# Patient Record
Sex: Female | Born: 1990 | Race: White | Hispanic: No | Marital: Single | Smoking: Former smoker
Health system: Southern US, Community
[De-identification: ages and names within clinical notes are randomized; demographics above are authoritative.]

## PROBLEM LIST (undated history)

## (undated) DIAGNOSIS — R519 Headache, unspecified: Secondary | ICD-10-CM

## (undated) DIAGNOSIS — O24419 Gestational diabetes mellitus in pregnancy, unspecified control: Secondary | ICD-10-CM

## (undated) DIAGNOSIS — Z8489 Family history of other specified conditions: Secondary | ICD-10-CM

## (undated) DIAGNOSIS — F329 Major depressive disorder, single episode, unspecified: Secondary | ICD-10-CM

## (undated) DIAGNOSIS — R51 Headache: Secondary | ICD-10-CM

## (undated) DIAGNOSIS — F32A Depression, unspecified: Secondary | ICD-10-CM

## (undated) DIAGNOSIS — D649 Anemia, unspecified: Secondary | ICD-10-CM

## (undated) HISTORY — PX: NO PAST SURGERIES: SHX2092

---
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
## 2012-03-18 ENCOUNTER — Emergency Department (HOSPITAL_COMMUNITY)
Admission: EM | Admit: 2012-03-18 | Discharge: 2012-03-18 | Disposition: A | Discharge status: Home / Self Care | Payer: Self-pay | Attending: Emergency Medicine | Admitting: Emergency Medicine

## 2012-03-18 ENCOUNTER — Encounter (HOSPITAL_COMMUNITY): Payer: Self-pay | Admitting: Emergency Medicine

## 2012-03-18 DIAGNOSIS — R112 Nausea with vomiting, unspecified: Secondary | ICD-10-CM | POA: Insufficient documentation

## 2012-03-18 DIAGNOSIS — R1110 Vomiting, unspecified: Secondary | ICD-10-CM

## 2012-03-18 DIAGNOSIS — E860 Dehydration: Secondary | ICD-10-CM | POA: Insufficient documentation

## 2012-03-18 DIAGNOSIS — R197 Diarrhea, unspecified: Secondary | ICD-10-CM | POA: Insufficient documentation

## 2012-03-18 LAB — POCT I-STAT, CHEM 8
Creatinine, Ser: 0.7 mg/dL (ref 0.50–1.10)
Glucose, Bld: 147 mg/dL — ABNORMAL HIGH (ref 70–99)
HCT: 46 % (ref 36.0–46.0)
Hemoglobin: 15.6 g/dL — ABNORMAL HIGH (ref 12.0–15.0)
Potassium: 4.2 mEq/L (ref 3.5–5.1)
TCO2: 25 mmol/L (ref 0–100)

## 2012-03-18 MED ORDER — PROMETHAZINE HCL 25 MG PO TABS: 25.0000 mg | ORAL_TABLET | Freq: Four times a day (QID) | ORAL | Status: DC | PRN

## 2012-03-18 MED ORDER — METOCLOPRAMIDE HCL 5 MG/ML IJ SOLN
10.0000 mg | Freq: Once | INTRAMUSCULAR | Status: AC
  Administered 2012-03-18: 10 mg via INTRAVENOUS
  Filled 2012-03-18: qty 2

## 2012-03-18 MED ORDER — SODIUM CHLORIDE 0.9 % IV SOLN: 1000.0000 mL | INTRAVENOUS | Status: DC

## 2012-03-18 MED ORDER — SODIUM CHLORIDE 0.9 % IV SOLN
1000.0000 mL | Freq: Once | INTRAVENOUS | Status: AC
  Administered 2012-03-18: 1000 mL via INTRAVENOUS

## 2012-03-18 MED ORDER — LOPERAMIDE HCL 2 MG PO CAPS
4.0000 mg | ORAL_CAPSULE | Freq: Once | ORAL | Status: AC
  Administered 2012-03-18: 4 mg via ORAL
  Filled 2012-03-18: qty 2

## 2012-03-18 MED ORDER — ONDANSETRON HCL 4 MG/2ML IJ SOLN
4.0000 mg | Freq: Once | INTRAMUSCULAR | Status: AC
  Administered 2012-03-18: 4 mg via INTRAVENOUS
  Filled 2012-03-18: qty 2

## 2012-03-18 MED ORDER — DIPHENHYDRAMINE HCL 50 MG/ML IJ SOLN
25.0000 mg | Freq: Once | INTRAMUSCULAR | Status: AC
  Administered 2012-03-18: 25 mg via INTRAVENOUS
  Filled 2012-03-18: qty 1

## 2012-03-18 MED ORDER — PROMETHAZINE HCL 25 MG RE SUPP: 25.0000 mg | Freq: Four times a day (QID) | RECTAL | Status: DC | PRN

## 2012-03-18 NOTE — ED Provider Notes (Signed)
History     CSN: [id number]  Arrival date & time 03/18/12  1116   First MD Initiated Contact with Patient 03/18/12 1151      Chief Complaint  Patient presents with  . Emesis  . Nausea  . Diarrhea    (Consider location/radiation/quality/duration/timing/severity/associated sxs/prior treatment) HPI  Patient reports she awakened at 3 AM with acute onset of nausea and then started having vomiting about 4-5 times. About 5 AM she had about 3 episodes of diarrhea. She states she feels like she has a band around her abdomen that goes into her back and described as a cramping pain. She currently still has nausea. Her last episode of diarrhea was about 2 hours ago. She relates her she feels like she could go at any time now. She denies any fever. She states just prior to arrival she drank milk and took some ibuprofen. She reports the night before her son had some vomiting however he is improved now. She denies eating or drinking anything that may have made her feel ill. She states she has weakness and lightheadedness.  PCP none  History reviewed. No pertinent past medical history.  History reviewed. No pertinent past surgical history.  No family history on file.  History  Substance Use Topics  . Smoking status: Never Smoker   . Smokeless tobacco: Not on file  . Alcohol Use: No  Lives at home Lives with spouse  OB History    Grav Para Term Preterm Abortions TAB SAB Ect Mult Living                  Review of Systems  All other systems reviewed and are negative.    Allergies  Review of patient's allergies indicates no known allergies.  Home Medications   Current Outpatient Rx  Name  Route  Sig  Dispense  Refill  . IBUPROFEN 200 MG PO TABS   Oral   Take 400 mg by mouth every 8 (eight) hours as needed. For pain.           BP 134/86  Pulse 87  Temp 97.6 F (36.4 C)  Resp 16  SpO2 97%  Vital signs normal    Physical Exam  Nursing note and vitals  reviewed. Constitutional: She is oriented to person, place, and time. She appears well-developed and well-nourished.  Non-toxic appearance. She does not appear ill. No distress.  HENT:  Head: Normocephalic and atraumatic.  Right Ear: External ear normal.  Left Ear: External ear normal.  Nose: Nose normal. No mucosal edema or rhinorrhea.  Mouth/Throat: Mucous membranes are normal. No dental abscesses or uvula swelling.       Dry tongue  Eyes: Conjunctivae normal and EOM are normal. Pupils are equal, round, and reactive to light.  Neck: Normal range of motion and full passive range of motion without pain. Neck supple.  Cardiovascular: Normal rate, regular rhythm and normal heart sounds.  Exam reveals no gallop and no friction rub.   No murmur heard. Pulmonary/Chest: Effort normal and breath sounds normal. No respiratory distress. She has no wheezes. She has no rhonchi. She has no rales. She exhibits no tenderness and no crepitus.  Abdominal: Soft. Normal appearance and bowel sounds are normal. She exhibits no distension. There is tenderness. There is no rebound and no guarding.       Mild tenderness in the mid abdomen  Musculoskeletal: Normal range of motion. She exhibits no edema and no tenderness.       Moves  all extremities well.   Neurological: She is alert and oriented to person, place, and time. She has normal strength. No cranial nerve deficit.  Skin: Skin is warm, dry and intact. No rash noted. No erythema. There is pallor.  Psychiatric: She has a normal mood and affect. Her speech is normal and behavior is normal. Her mood appears not anxious.    ED Course  Procedures (including critical care time)   Medications  ibuprofen (ADVIL,MOTRIN) 200 MG tablet (not administered)  0.9 %  sodium chloride infusion (0 mL Intravenous Stopped 03/18/12 1353)    Followed by  0.9 %  sodium chloride infusion (1000 mL Intravenous New Bag/Given 03/18/12 1355)    Followed by  0.9 %  sodium chloride  infusion (not administered)  metoCLOPramide (REGLAN) injection 10 mg (10 mg Intravenous Given 03/18/12 1254)  diphenhydrAMINE (BENADRYL) injection 25 mg (25 mg Intravenous Given 03/18/12 1255)  ondansetron (ZOFRAN) injection 4 mg (4 mg Intravenous Given 03/18/12 1253)  loperamide (IMODIUM) capsule 4 mg (4 mg Oral Given 03/18/12 1257)   Recheck after first liter patient feeling better, no urinary output. Will order second liter  Recheck at 1420 patient has colored her face now. She is sitting up and looks like she feels much improved. She's been able to drink some fluids and is feeling ready to go home.   Results for orders placed during the hospital encounter of 03/18/12  POCT I-STAT, CHEM 8      Component Value Range   Sodium 140  135 - 145 mEq/L   Potassium 4.2  3.5 - 5.1 mEq/L   Chloride 105  96 - 112 mEq/L   BUN 19  6 - 23 mg/dL   Creatinine, Ser 4.54  0.50 - 1.10 mg/dL   Glucose, Bld 098 (*) 70 - 99 mg/dL   Calcium, Ion 1.19  1.47 - 1.23 mmol/L   TCO2 25  0 - 100 mmol/L   Hemoglobin 15.6 (*) 12.0 - 15.0 g/dL   HCT 82.9  56.2 - 13.0 %   Laboratory interpretation all normal except mildly elevated hemoglobin consistent with dehydration .   1. Vomiting and diarrhea   2. Dehydration     New Prescriptions   PROMETHAZINE (PHENERGAN) 25 MG SUPPOSITORY    Place 1 suppository (25 mg total) rectally every 6 (six) hours as needed for nausea.   PROMETHAZINE (PHENERGAN) 25 MG TABLET    Take 1 tablet (25 mg total) by mouth every 6 (six) hours as needed for nausea.    Plan discharge  Devoria Albe, MD, FACEP   MDM          Ward Givens, MD 03/18/12 1430

## 2012-03-18 NOTE — ED Notes (Signed)
Woke up at 0300 this am vomiting and then every 20 minutes until around 1000, watery diarrhea, child in house has same

## 2012-03-18 NOTE — Progress Notes (Signed)
On 03/18/12 during [facility] ED visit pt was noted to be listed with no insurance coverage CM and [facility] liaison spoke with pt.  Pt offered services to assist with finding a [place] self pay provider & health reform information Pt states she is completing application and should have medicaid for her son- Mothers generally receive family planning limited medicaid when they have a newborn/toddler but may not have hospital plus other services

## 2013-08-14 ENCOUNTER — Emergency Department: Payer: Self-pay | Admitting: Internal Medicine

## 2013-09-05 ENCOUNTER — Emergency Department: Payer: Self-pay | Admitting: Emergency Medicine

## 2013-09-07 LAB — BETA STREP CULTURE(ARMC)

## 2014-07-10 ENCOUNTER — Ambulatory Visit
Admit: 2014-07-10 | Disposition: A | Discharge status: Home / Self Care | Payer: Self-pay | Attending: Physician Assistant | Admitting: Physician Assistant

## 2015-04-21 ENCOUNTER — Encounter: Payer: Self-pay | Admitting: Emergency Medicine

## 2015-04-21 ENCOUNTER — Emergency Department: Payer: Worker's Compensation

## 2015-04-21 ENCOUNTER — Emergency Department
Admission: EM | Admit: 2015-04-21 | Discharge: 2015-04-21 | Disposition: A | Discharge status: Home / Self Care | Payer: Worker's Compensation | Attending: Emergency Medicine | Admitting: Emergency Medicine

## 2015-04-21 DIAGNOSIS — W208XXA Other cause of strike by thrown, projected or falling object, initial encounter: Secondary | ICD-10-CM | POA: Insufficient documentation

## 2015-04-21 DIAGNOSIS — Y9389 Activity, other specified: Secondary | ICD-10-CM | POA: Insufficient documentation

## 2015-04-21 DIAGNOSIS — S60212A Contusion of left wrist, initial encounter: Secondary | ICD-10-CM | POA: Diagnosis not present

## 2015-04-21 DIAGNOSIS — Y998 Other external cause status: Secondary | ICD-10-CM | POA: Diagnosis not present

## 2015-04-21 DIAGNOSIS — S62025A Nondisplaced fracture of middle third of navicular [scaphoid] bone of left wrist, initial encounter for closed fracture: Secondary | ICD-10-CM | POA: Diagnosis not present

## 2015-04-21 DIAGNOSIS — S62002A Unspecified fracture of navicular [scaphoid] bone of left wrist, initial encounter for closed fracture: Secondary | ICD-10-CM

## 2015-04-21 DIAGNOSIS — Y9289 Other specified places as the place of occurrence of the external cause: Secondary | ICD-10-CM | POA: Insufficient documentation

## 2015-04-21 DIAGNOSIS — S6992XA Unspecified injury of left wrist, hand and finger(s), initial encounter: Secondary | ICD-10-CM | POA: Diagnosis present

## 2015-04-21 MED ORDER — IBUPROFEN 800 MG PO TABS: 800.0000 mg | ORAL_TABLET | Freq: Three times a day (TID) | ORAL | Status: DC | PRN

## 2015-04-21 NOTE — Discharge Instructions (Signed)
Scaphoid Fracture, Wrist A fracture is a break in the bone. The bone you have broken often does not show up as a fracture on x-ray until later on in the healing phase. This bone is called the scaphoid bone. With this bone, your caregiver will often cast or splint your wrist as though it is fractured, even if a fracture is not seen on the x-ray. This is often done with wrist injuries in which there is tenderness at the base of the thumb. An x-ray at 1-3 weeks after your injury may confirm this fracture. A cast or splint is used to protect and keep your injured bone in good position for healing. The cast or splint will be on generally for about 6 to 16 weeks, depending on your health, age, the fracture location and how quickly you heal. Another name for the scaphoid bone is the navicular bone. HOME CARE INSTRUCTIONS   To lessen the swelling and pain, keep the injured part elevated above your heart while sitting or lying down.  Apply ice to the injury for 15-20 minutes, 03-04 times per day while awake, for 2 days. Put the ice in a plastic bag and place a thin towel between the bag of ice and your cast.  If you have a plaster or fiberglass cast or splint:  Do not try to scratch the skin under the cast using sharp or pointed objects.  Check the skin around the cast every day. You may put lotion on any red or sore areas.  Keep your cast or splint dry and clean.  If you have a plaster splint:  Wear the splint as directed.  You may loosen the elastic bandage around the splint if your fingers become numb, tingle, or turn cold or blue.  If you have been put in a removable splint, wear and use as directed.  Do not put pressure on any part of your cast or splint; it may deform or break. Rest your cast or splint only on a pillow the first 24 hours until it is fully hardened.  Your cast or splint can be protected during bathing with a plastic bag. Do not lower the cast or splint into water.  Only take  over-the-counter or prescription medicines for pain, discomfort, or fever as directed by your caregiver.  If your caregiver has given you a follow up appointment, it is very important to keep that appointment. Not keeping the appointment could result in chronic pain and decreased function. If there is any problem keeping the appointment, you must call back to this facility for assistance. SEEK IMMEDIATE MEDICAL CARE IF:   Your cast gets damaged, wet or breaks.  You have continued severe pain or more swelling than you did before the cast or splint was put on.  Your skin or nails below the injury turn blue or gray, or feel cold or numb.  You have tingling or burning pain in your fingers or increasing pain with movement of your fingers   This information is not intended to replace advice given to you by your health care provider. Make sure you discuss any questions you have with your health care provider.   Document Released: 03/07/2002 Document Revised: 06/09/2011 Document Reviewed: 09/27/2014 Elsevier Interactive Patient Education 2016 [employer]vier [employer]   Continue ibuprofen for pain and swelling. Follow-up with the orthopedist next week for further evaluation. Return to emergency room for any concerns.

## 2015-04-21 NOTE — ED Notes (Signed)
Assessed per PA 

## 2015-04-21 NOTE — ED Provider Notes (Signed)
[facility] Emergency Department Provider Note  ____________________________________________  Time seen: Approximately 7:01 PM  I have reviewed the triage vital signs and the nursing notes.   HISTORY  Chief Complaint Wrist Pain    HPI Sharon Solis is a 25 y.o. female who reports a heavy object hitting her left wrist, radial side prior to arrival. She has pain with lateral movement of the wrist. She has mild bruising. This occurred at her place of employment. Food trays fell onto her wrist.   History reviewed. No pertinent past medical history.  There are no active problems to display for this patient.   History reviewed. No pertinent past surgical history.  Current Outpatient Rx  Name  Route  Sig  Dispense  Refill  . ibuprofen (ADVIL,MOTRIN) 800 MG tablet   Oral   Take 1 tablet (800 mg total) by mouth every 8 (eight) hours as needed.   15 tablet   0   . promethazine (PHENERGAN) 25 MG suppository   Rectal   Place 1 suppository (25 mg total) rectally every 6 (six) hours as needed for nausea.   4 each   0   . promethazine (PHENERGAN) 25 MG tablet   Oral   Take 1 tablet (25 mg total) by mouth every 6 (six) hours as needed for nausea.   4 tablet   0     Allergies Review of patient's allergies indicates no known allergies.  No family history on file.  Social History Social History  Substance Use Topics  . Smoking status: Never Smoker   . Smokeless tobacco: None  . Alcohol Use: No    Review of Systems Constitutional: No fever/chills Eyes: No visual changes. ENT: No sore throat. Musculoskeletal: Negative for back pain. Skin: Negative for rash. Neurological: Negative for headaches, focal weakness or numbness. 10-point ROS otherwise negative.  ____________________________________________   PHYSICAL EXAM:  VITAL SIGNS: ED Triage Vitals  Enc Vitals Group     BP 04/21/15 1833 118/80 mmHg     Pulse Rate 04/21/15 1833  100     Resp 04/21/15 1833 98     Temp 04/21/15 1833 98 F (36.7 C)     Temp src --      SpO2 04/21/15 1833 98 %     Weight 04/21/15 1833 210 lb (95.255 kg)     Height 04/21/15 1833  (1.676 m)     Head Cir --      Peak Flow --      Pain Score 04/21/15 1834 8     Pain Loc --      Pain Edu? --      Excl. in GC? --     Constitutional: Alert and oriented. Well appearing and in no acute distress. Eyes: Conjunctivae are normal. PERRL. EOMI. Ears:  Clear with normal landmarks. No erythema. Head: Atraumatic.  Musculoskeletal: Normal range of motion of the left elbow. Tender over the radial aspect of the left wrist in the snuffbox region. Mild bruising, with mild swelling. Flexion and extension intact. Nontender over the phalanges. Pain with range of motion of the thumb. Nontender over the metacarpal bones. 2+ radial pulse. Nontender over the ulna. Neurologic:  Normal speech and language. No gross focal neurologic deficits are appreciated. No gait instability. Skin:  Skin is warm, dry and intact. No rash noted. Psychiatric: Mood and affect are normal. Speech and behavior are normal.  ____________________________________________   LABS (all labs ordered are listed, but only abnormal results are  displayed)  Labs Reviewed - No data to display ____________________________________________  EKG   ____________________________________________  RADIOLOGY  CLINICAL DATA: Distal radial pain, status post fall on the wrist.  EXAM: LEFT WRIST - COMPLETE 3+ VIEW  COMPARISON: None.  FINDINGS: There is no evidence of displaced fracture or dislocation. Subtle curvilinear lucency is seen through the midshaft of the scaphoid bone. There is no evidence of arthropathy or other focal bone abnormality. Soft tissues are unremarkable.  IMPRESSION: No evidence of displaced fracture.  Subtle curvilinear lucency through the midshaft of the scaphoid bone. These may represent a normal  anatomic variant or represent subtle nondisplaced fracture. Please correlate to point tenderness in the anatomic snuffbox. Repeated radiograph in 10-14 days may be considered if point tenderness is present.   Electronically Signed  By: Ted Mcalpine M.D.  On: 04/21/2015 19:04 ____________________________________________   PROCEDURES  Procedure(s) performed: None  Critical Care performed: No  ____________________________________________   INITIAL IMPRESSION / ASSESSMENT AND PLAN / ED COURSE  Pertinent labs & imaging results that were available during my care of the patient were reviewed by me and considered in my medical decision making (see chart for details).  25 year old female with trauma to the left wrist. X-ray with possible subtle scaphoid fracture.  Place in thumb spica splint and encouraged ice, brace and ibuprofen. She will follow-up with orthopedics next week  5-7 days for further evaluation. Work note given.   FINAL CLINICAL IMPRESSION(S) / ED DIAGNOSES  Final diagnoses:  Scaphoid fracture, wrist, closed, left, initial encounter      Ignacia Bayley, PA-C 04/21/15 1934  Arnaldo Natal, MD 04/21/15 [phone]

## 2015-04-21 NOTE — ED Notes (Signed)
Splint applied. Capillary refills and pluses checked prior and after splinting application. Pt. Given instructions and verbalized comfort with splint on. Nurse and physician notified.  

## 2015-04-21 NOTE — ED Notes (Signed)
Splint applied by tech and pulses checked before discharge

## 2015-04-21 NOTE — ED Notes (Signed)
Trying to catch falling trays overhead approx 3pm. Pain L wrist.

## 2015-07-20 ENCOUNTER — Emergency Department: Payer: Medicaid Other

## 2015-07-20 ENCOUNTER — Emergency Department
Admission: EM | Admit: 2015-07-20 | Discharge: 2015-07-20 | Disposition: A | Discharge status: Home / Self Care | Payer: Medicaid Other | Attending: Emergency Medicine | Admitting: Emergency Medicine

## 2015-07-20 DIAGNOSIS — M65242 Calcific tendinitis, left hand: Secondary | ICD-10-CM | POA: Diagnosis not present

## 2015-07-20 DIAGNOSIS — M778 Other enthesopathies, not elsewhere classified: Secondary | ICD-10-CM

## 2015-07-20 DIAGNOSIS — M25532 Pain in left wrist: Secondary | ICD-10-CM | POA: Diagnosis present

## 2015-07-20 DIAGNOSIS — M779 Enthesopathy, unspecified: Secondary | ICD-10-CM

## 2015-07-20 NOTE — ED Notes (Signed)
Pt here for left wrist pain.  Has broken wrist a few months ago.

## 2015-07-20 NOTE — ED Provider Notes (Signed)
[facility] Emergency Department Provider Note ____________________________________________  Time seen: 2009  I have reviewed the triage vital signs and the nursing notes.  HISTORY  Chief Complaint  Wrist Pain  HPI Sharon Solis is a 25 y.o. female presents to the ED for evaluation of pain to her left wrist this morning after she pushed off to get out of bed. The patient has been evaluated recently by orthopedics for a suspected scaphoid fracture distal left wrist from January. She had repeat x-rays with orthopedics provider about a month ago, which showed normal healing of the scaphoid. 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 She describes pain seems to be worse when she flexes the thumb towards the wrist. She describes the pain as "grinding"in nature. She denies any grip changes, distal paresthesias, or swelling to the hand. She reports overall discomfort 8/10 in triage. She does note that the pain is intermittent.  No past medical history on file.  There are no active problems to display for this patient.  No past surgical history on file.  Current Outpatient Rx  Name  Route  Sig  Dispense  Refill  . ibuprofen (ADVIL,MOTRIN) 800 MG tablet   Oral   Take 1 tablet (800 mg total) by mouth every 8 (eight) hours as needed.   15 tablet   0   . promethazine (PHENERGAN) 25 MG suppository   Rectal   Place 1 suppository (25 mg total) rectally every 6 (six) hours as needed for nausea.   4 each   0   . promethazine (PHENERGAN) 25 MG tablet   Oral   Take 1 tablet (25 mg total) by mouth every 6 (six) hours as needed for nausea.   4 tablet   0    Allergies Review of patient's allergies indicates no known allergies.  No family history on file.  Social History Social History  Substance Use Topics  . Smoking status: Never Smoker   . Smokeless tobacco: Not on file  . Alcohol Use: No    Review of Systems  Constitutional: Negative for fever. Cardiovascular: Negative for chest pain. Respiratory: Negative for shortness of breath. Musculoskeletal: Negative for back pain. Left wrist pain as above.  Skin: Negative for rash. Neurological: Negative for headaches, focal weakness or numbness. ____________________________________________  PHYSICAL EXAM:  VITAL SIGNS: ED Triage Vitals  Enc Vitals Group     BP 07/20/15 1845 147/78 mmHg     Pulse Rate 07/20/15 1845 97     Resp 07/20/15 1845 16     Temp 07/20/15 1845 98.9 F (37.2 C)     Temp Source 07/20/15 1845 Oral     SpO2 07/20/15 1845 97 %     Weight --      Height --      Head Cir --      Peak Flow --      Pain Score 07/20/15 1845 8     Pain Loc --      Pain Edu? --      Excl. in GC? --    Constitutional: Alert and oriented. Well appearing and in no distress. Head: Normocephalic and atraumatic. Hematological/Lymphatic/Immunological: No cervical lymphadenopathy. Cardiovascular: Normal rate, regular rhythm.  Respiratory: Normal respiratory effort. Musculoskeletal: Left hand and wrist without any obvious deformity, or radial swelling.  She does have a mildly positive Finklestein on exam. She is not tender to palpation in the anatomical snuffbox. Nontender with  normal range of motion in all extremities.  Neurologic: Cranial nerves II through XII grossly intact. Patient with normal intrinsic and opposition testing.  Normal gait without ataxia. Normal speech and language. No gross focal neurologic deficits are appreciated. Skin:  Skin is warm, dry and intact. No rash noted. ____________________________________________   RADIOLOGY  Left Wrist IMPRESSION: No acute fracture or dislocation of the left wrist. ____________________________________________  INITIAL IMPRESSION / ASSESSMENT AND PLAN / ED COURSE  Evaluation with a likely left wrist tendinitis at the thumb. She'll be discharged with continued use of her  thumb spica splint and encouraged to apply ice for inflammation and pain relief. She'll follow up with a primary care provider or her orthopedics specialist for ongoing symptom management. ____________________________________________  FINAL CLINICAL IMPRESSION(S) / ED DIAGNOSES  Final diagnoses:  Tendinitis of thumb      Lissa Hoard, PA-C 07/20/15 2100  Phineas Semen, MD 07/21/15 1556

## 2015-07-20 NOTE — Discharge Instructions (Signed)
Tendinitis and Tenosynovitis  °Tendinitis is inflammation of the tendon. Tenosynovitis is inflammation of the lining around the tendon (tendon sheath). These painful conditions often occur at once. Tendons attach muscle to bone. To move a limb, force from the muscle moves through the tendon, to the bone. These conditions often cause increased pain when moving. Tendinitis may be caused by a small or partial tear in the tendon.  °SYMPTOMS  °· Pain, tenderness, redness, bruising, or swelling at the injury. °· Loss of normal joint movement. °· Pain that gets worse with use of the muscle and joint attached to the tendon. °· Weakness in the tendon, caused by calcium build up that may occur with tendinitis. °· Commonly affected tendons: °¨ Achilles tendon (calf of leg). °¨ Rotator cuff (shoulder joint). °¨ Patellar tendon (kneecap to shin). °¨ Peroneal tendon (ankle). °¨ Posterior tibial tendon (inner ankle). °¨ Biceps tendon (in front of shoulder). °CAUSES  °· Sudden strain on a flexed muscle, muscle overuse, sudden increase or change in activity, vigorous activity. °· Result of a direct hit (less common). °· Poor muscle action (biomechanics). °RISK INCREASES WITH: °· Injury (trauma). °· Too much exercise. °· Sudden change in athletic activity. °· Incorrect exercise form or technique. °· Poor strength and flexibility. °· Not warming-up properly before activity. °· Returning to activity before healing is complete. °PREVENTION  °· Warm-up and stretch properly before activity. °· Maintain physical fitness: °¨ Joint flexibility. °¨ Muscle strength and endurance. °¨ Fitness that increases heart rate. °· Learn and use proper exercise techniques. °· Use rehabilitation exercises to strengthen weak muscles and tendons. °· Ice the tendon after activity, to reduce recurring inflammation. °· Wear proper fitting protective equipment for specific tendons, when indicated. °PROGNOSIS  °When treated properly, can be cured in 6 to 8 weeks.  Recovery may take longer, depending on degree of injury.  °RELATED COMPLICATIONS  °· Re-injury or recurring symptoms. °· Permanent weakness or joint stiffness, if injury is severe and recovery is not completed. °· Delayed healing, if sports are started before healing is complete. °· Tearing apart (rupture) of the inflamed tendon. Tendinitis means the tendon is injured and must recover. °TREATMENT  °Treatment first involves ice, medicine, and rest from aggravating activities. This reduces pain and inflammation. Modifying your activity may be considered to prevent recurring injury. A brace, elastic bandage wrap, splint, cast, or sling may be prescribed to protect the joint for a short period. After that period, strengthening and stretching exercise may help to regain strength and full range of motion. If the condition persists, despite non-surgical treatment, surgery may be recommended to remove the inflamed tendon lining. Corticosteroid injections may be given to reduce inflammation. However, these injections may weaken the tendon and increase your risk for tendon rupture. °MEDICATION  °· If pain medicine is needed, nonsteroidal anti-inflammatory medicines (aspirin and ibuprofen), or other minor pain relievers (acetaminophen), are often recommended. °· Do not take pain medicine for 7 days before surgery. °· Prescription pain relievers are usually prescribed only after surgery. Use only as directed and only as much as you need. °· Ointments applied to the skin may be helpful. °· Corticosteroid injections may be given to reduce inflammation. However, this may increase your risk of a tendon rupture. °HEAT AND COLD °· Cold treatment (icing) relieves pain and reduces inflammation. Cold treatment should be applied for 10 to 15 minutes every 2 to 3 hours, and immediately after activity that aggravates your symptoms. Use ice packs or an ice massage. °· Heat   treatment may be used before performing stretching and strengthening  activities prescribed by your caregiver, physical therapist, or athletic trainer. Use a heat pack or a warm water soak. °SEEK MEDICAL CARE IF:  °· Symptoms get worse or do not improve, despite treatment. °· Pain becomes too much to tolerate. °· You develop numbness or tingling. °· Toes become cold, or toenails become blue, gray, or dark colored. °· New, unexplained symptoms develop. (Drugs used in treatment may produce side effects.) °  °This information is not intended to replace advice given to you by your health care provider. Make sure you discuss any questions you have with your health care provider. °  °Document Released: 03/17/2005 Document Revised: 06/09/2011 Document Reviewed: 06/29/2008 °Elsevier Interactive Patient Education ©2016 Elsevier Inc. ° °

## 2015-07-20 NOTE — ED Notes (Signed)
Reviewed d/c instructions and follow-up care with pt. Pt verbalized understanding 

## 2015-09-12 ENCOUNTER — Other Ambulatory Visit: Payer: Self-pay | Admitting: Surgery

## 2015-09-12 DIAGNOSIS — R1011 Right upper quadrant pain: Secondary | ICD-10-CM

## 2015-09-28 ENCOUNTER — Ambulatory Visit: Payer: Medicaid Other

## 2015-10-09 ENCOUNTER — Encounter: Payer: Self-pay | Admitting: *Deleted

## 2015-10-09 ENCOUNTER — Inpatient Hospital Stay: Admission: RE | Admit: 2015-10-09 | Payer: Self-pay | Source: Ambulatory Visit

## 2015-10-09 NOTE — Patient Instructions (Signed)
  Your procedure is scheduled on: 10-16-15 Report to Same Day Surgery 2nd floor medical mall To find out your arrival time please call [phone] between 1PM - 3PM on  10-15-15  Remember: Instructions that are not followed completely may result in serious medical risk, up to and including death, or upon the discretion of your surgeon and anesthesiologist your surgery may need to be rescheduled.    _x___ 1. Do not eat food or drink liquids after midnight. No gum chewing or hard candies.     __x__ 2. No Alcohol for 24 hours before or after surgery.   __x__3. No Smoking for 24 prior to surgery.   ____  4. Bring all medications with you on the day of surgery if instructed.    __x__ 5. Notify your doctor if there is any change in your medical condition     (cold, fever, infections).     Do not wear jewelry, make-up, hairpins, clips or nail polish.  Do not wear lotions, powders, or perfumes. You may wear deodorant.  Do not shave 48 hours prior to surgery. Men may shave face and neck.  Do not bring valuables to the hospital.    [facility] is not responsible for any belongings or valuables.               Contacts, dentures or bridgework may not be worn into surgery.  Leave your suitcase in the car. After surgery it may be brought to your room.  For patients admitted to the hospital, discharge time is determined by your treatment team.   Patients discharged the day of surgery will not be allowed to drive home.    Please read over the following fact sheets that you were given:   [facility] Preparing for [facility] and or MRSA Information   ____ Take these medicines the morning of surgery with A SIP OF WATER:    1. NONE  2.  3.  4.  5.  6.  ____ Fleet Enema (as directed)   ____ Use CHG Soap or sage wipes as directed on instruction sheet   ____ Use inhalers on the day of surgery and bring to hospital day of surgery  ____ Stop metformin 2 days prior to surgery    ____ Take 1/2 of  usual insulin dose the night before surgery and none on the morning of surgery.   ____ Stop aspirin or coumadin, or plavix  _x__ Stop Anti-inflammatories such as Advil, Aleve, Ibuprofen, Motrin, Naproxen,          Naprosyn, Goodies powders or aspirin products. Ok to take Tylenol.   ____ Stop supplements until after surgery.    ____ Bring C-Pap to the hospital.

## 2015-10-16 ENCOUNTER — Ambulatory Visit: Payer: Self-pay | Admitting: Anesthesiology

## 2015-10-16 ENCOUNTER — Encounter: Payer: Self-pay | Admitting: Emergency Medicine

## 2015-10-16 ENCOUNTER — Ambulatory Visit: Payer: Self-pay

## 2015-10-16 ENCOUNTER — Encounter
Admission: RE | Disposition: A | Discharge status: Home / Self Care | Payer: Self-pay | Source: Ambulatory Visit | Attending: Surgery

## 2015-10-16 ENCOUNTER — Ambulatory Visit
Admission: RE | Admit: 2015-10-16 | Discharge: 2015-10-16 | Disposition: A | Discharge status: Home / Self Care | Payer: Self-pay | Source: Ambulatory Visit | Attending: Surgery | Admitting: Surgery

## 2015-10-16 DIAGNOSIS — Z79899 Other long term (current) drug therapy: Secondary | ICD-10-CM | POA: Insufficient documentation

## 2015-10-16 DIAGNOSIS — F329 Major depressive disorder, single episode, unspecified: Secondary | ICD-10-CM | POA: Insufficient documentation

## 2015-10-16 DIAGNOSIS — K8020 Calculus of gallbladder without cholecystitis without obstruction: Secondary | ICD-10-CM

## 2015-10-16 DIAGNOSIS — R51 Headache: Secondary | ICD-10-CM | POA: Insufficient documentation

## 2015-10-16 DIAGNOSIS — D649 Anemia, unspecified: Secondary | ICD-10-CM | POA: Insufficient documentation

## 2015-10-16 DIAGNOSIS — K8010 Calculus of gallbladder with chronic cholecystitis without obstruction: Secondary | ICD-10-CM | POA: Insufficient documentation

## 2015-10-16 HISTORY — DX: Family history of other specified conditions: Z84.89

## 2015-10-16 HISTORY — DX: Major depressive disorder, single episode, unspecified: F32.9

## 2015-10-16 HISTORY — PX: CHOLECYSTECTOMY: SHX55

## 2015-10-16 HISTORY — DX: Depression, unspecified: F32.A

## 2015-10-16 HISTORY — DX: Headache, unspecified: R51.9

## 2015-10-16 HISTORY — DX: Anemia, unspecified: D64.9

## 2015-10-16 HISTORY — DX: Headache: R51

## 2015-10-16 LAB — POCT PREGNANCY, URINE: PREG TEST UR: NEGATIVE

## 2015-10-16 SURGERY — LAPAROSCOPIC CHOLECYSTECTOMY WITH INTRAOPERATIVE CHOLANGIOGRAM
Anesthesia: General | Site: Abdomen | Wound class: Clean Contaminated

## 2015-10-16 MED ORDER — ROCURONIUM BROMIDE 100 MG/10ML IV SOLN
INTRAVENOUS | Status: DC | PRN
  Administered 2015-10-16: 40 mg via INTRAVENOUS
  Administered 2015-10-16: 10 mg via INTRAVENOUS

## 2015-10-16 MED ORDER — HYDROCODONE-ACETAMINOPHEN 5-325 MG PO TABS
1.0000 | ORAL_TABLET | ORAL | Status: DC | PRN
  Administered 2015-10-16: 1 via ORAL

## 2015-10-16 MED ORDER — MIDAZOLAM HCL 2 MG/2ML IJ SOLN
INTRAMUSCULAR | Status: DC | PRN
  Administered 2015-10-16: 2 mg via INTRAVENOUS

## 2015-10-16 MED ORDER — DEXAMETHASONE SODIUM PHOSPHATE 10 MG/ML IJ SOLN
INTRAMUSCULAR | Status: DC | PRN
  Administered 2015-10-16: 10 mg via INTRAVENOUS

## 2015-10-16 MED ORDER — NEOSTIGMINE METHYLSULFATE 10 MG/10ML IV SOLN
INTRAVENOUS | Status: DC | PRN
  Administered 2015-10-16: 3.5 mg via INTRAVENOUS

## 2015-10-16 MED ORDER — SODIUM CHLORIDE 0.9 % IV SOLN
INTRAVENOUS | Status: DC | PRN
  Administered 2015-10-16: 07:00:00 via INTRAMUSCULAR

## 2015-10-16 MED ORDER — HYDROCODONE-ACETAMINOPHEN 5-325 MG PO TABS: 1.0000 | ORAL_TABLET | ORAL | Status: DC | PRN

## 2015-10-16 MED ORDER — FENTANYL CITRATE (PF) 100 MCG/2ML IJ SOLN
25.0000 ug | INTRAMUSCULAR | Status: DC | PRN
  Administered 2015-10-16 (×4): 25 ug via INTRAVENOUS

## 2015-10-16 MED ORDER — FENTANYL CITRATE (PF) 100 MCG/2ML IJ SOLN
INTRAMUSCULAR | Status: DC | PRN
  Administered 2015-10-16: 100 ug via INTRAVENOUS
  Administered 2015-10-16: 50 ug via INTRAVENOUS

## 2015-10-16 MED ORDER — FENTANYL CITRATE (PF) 100 MCG/2ML IJ SOLN
INTRAMUSCULAR | Status: AC
  Administered 2015-10-16: 25 ug via INTRAVENOUS
  Filled 2015-10-16: qty 2

## 2015-10-16 MED ORDER — SODIUM CHLORIDE 0.9 % IV SOLN
INTRAVENOUS | Status: DC | PRN
  Administered 2015-10-16: 08:00:00

## 2015-10-16 MED ORDER — FAMOTIDINE 20 MG PO TABS
20.0000 mg | ORAL_TABLET | Freq: Once | ORAL | Status: AC
  Administered 2015-10-16: 20 mg via ORAL

## 2015-10-16 MED ORDER — HYDROCODONE-ACETAMINOPHEN 5-325 MG PO TABS
ORAL_TABLET | ORAL | Status: AC
  Filled 2015-10-16: qty 1

## 2015-10-16 MED ORDER — LIDOCAINE HCL (CARDIAC) 20 MG/ML IV SOLN
INTRAVENOUS | Status: DC | PRN
Start: 2015-10-16 — End: 2015-10-16
  Administered 2015-10-16: 100 mg via INTRAVENOUS

## 2015-10-16 MED ORDER — FAMOTIDINE 20 MG PO TABS
ORAL_TABLET | ORAL | Status: AC
  Administered 2015-10-16: 20 mg via ORAL
  Filled 2015-10-16: qty 1

## 2015-10-16 MED ORDER — PROPOFOL 10 MG/ML IV BOLUS
INTRAVENOUS | Status: DC | PRN
  Administered 2015-10-16: 180 mg via INTRAVENOUS

## 2015-10-16 MED ORDER — ONDANSETRON HCL 4 MG/2ML IJ SOLN: 4.0000 mg | Freq: Once | INTRAMUSCULAR | Status: DC | PRN

## 2015-10-16 MED ORDER — HYDROMORPHONE HCL 1 MG/ML IJ SOLN
INTRAMUSCULAR | Status: DC | PRN
  Administered 2015-10-16 (×2): .6 mg via INTRAVENOUS

## 2015-10-16 MED ORDER — LACTATED RINGERS IV SOLN
INTRAVENOUS | Status: DC
  Administered 2015-10-16: 08:00:00 via INTRAVENOUS

## 2015-10-16 MED ORDER — GLYCOPYRROLATE 0.2 MG/ML IJ SOLN
INTRAMUSCULAR | Status: DC | PRN
Start: 2015-10-16 — End: 2015-10-16
  Administered 2015-10-16: 0.6 mg via INTRAVENOUS

## 2015-10-16 MED ORDER — HEPARIN SODIUM (PORCINE) 5000 UNIT/ML IJ SOLN
INTRAMUSCULAR | Status: AC
  Filled 2015-10-16: qty 1

## 2015-10-16 SURGICAL SUPPLY — 36 items
APPLIER CLIP ROT 10 11.4 M/L (STAPLE) ×2
CANISTER SUCT 1200ML W/VALVE (MISCELLANEOUS) ×2 IMPLANT
CANNULA DILATOR 10 W/SLV (CANNULA) ×2 IMPLANT
CATH REDDICK CHOLANGI 4FR 50CM (CATHETERS) ×2 IMPLANT
CHLORAPREP W/TINT 26ML (MISCELLANEOUS) ×2 IMPLANT
CLIP APPLIE ROT 10 11.4 M/L (STAPLE) ×1 IMPLANT
DRAPE SHEET LG 3/4 BI-LAMINATE (DRAPES) ×2 IMPLANT
ELECT REM PT RETURN 9FT ADLT (ELECTROSURGICAL) ×2
ELECTRODE REM PT RTRN 9FT ADLT (ELECTROSURGICAL) ×1 IMPLANT
ENDOPOUCH RETRIEVER 10 (MISCELLANEOUS) ×2 IMPLANT
GAUZE SPONGE 4X4 12PLY STRL (GAUZE/BANDAGES/DRESSINGS) ×2 IMPLANT
GLOVE BIO SURGEON STRL SZ7.5 (GLOVE) ×2 IMPLANT
GOWN STRL REUS W/ TWL LRG LVL3 (GOWN DISPOSABLE) ×4 IMPLANT
GOWN STRL REUS W/TWL LRG LVL3 (GOWN DISPOSABLE) ×4
IRRIGATION STRYKERFLOW (MISCELLANEOUS) ×1 IMPLANT
IRRIGATOR STRYKERFLOW (MISCELLANEOUS) ×2
IV NS 1000ML (IV SOLUTION) ×1
IV NS 1000ML BAXH (IV SOLUTION) ×1 IMPLANT
KIT RM TURNOVER STRD PROC AR (KITS) ×2 IMPLANT
LABEL OR SOLS (LABEL) ×2 IMPLANT
NDL INSUFF ACCESS 14 VERSASTEP (NEEDLE) ×2 IMPLANT
NEEDLE FILTER BLUNT 18X 1/2SAF (NEEDLE) ×1
NEEDLE FILTER BLUNT 18X1 1/2 (NEEDLE) ×1 IMPLANT
NS IRRIG 500ML POUR BTL (IV SOLUTION) ×2 IMPLANT
PACK LAP CHOLECYSTECTOMY (MISCELLANEOUS) ×2 IMPLANT
SCISSORS METZENBAUM CVD 33 (INSTRUMENTS) ×2 IMPLANT
SEAL FOR SCOPE WARMER C3101 (MISCELLANEOUS) ×2 IMPLANT
SLEEVE ENDOPATH XCEL 5M (ENDOMECHANICALS) ×2 IMPLANT
STRIP CLOSURE SKIN 1/2X4 (GAUZE/BANDAGES/DRESSINGS) ×2 IMPLANT
SUT CHROMIC 5 0 RB 1 27 (SUTURE) ×2 IMPLANT
SUT VIC AB 0 CT2 27 (SUTURE) ×2 IMPLANT
SYR 3ML LL SCALE MARK (SYRINGE) ×2 IMPLANT
TROCAR XCEL NON-BLD 11X100MML (ENDOMECHANICALS) ×2 IMPLANT
TROCAR XCEL NON-BLD 5MMX100MML (ENDOMECHANICALS) ×2 IMPLANT
TUBING INSUFFLATOR HI FLOW (MISCELLANEOUS) ×2 IMPLANT
WATER STERILE IRR 1000ML POUR (IV SOLUTION) ×2 IMPLANT

## 2015-10-16 NOTE — Anesthesia Preprocedure Evaluation (Signed)
Anesthesia Evaluation  Patient identified by MRN, date of birth, ID band Patient awake    Reviewed: Allergy & Precautions, NPO status , Patient's Chart, lab work & pertinent test results  History of Anesthesia Complications Negative for: history of anesthetic complications  Airway Mallampati: II       Dental   Pulmonary neg pulmonary ROS,           Cardiovascular negative cardio ROS       Neuro/Psych Depression negative neurological ROS     GI/Hepatic negative GI ROS, Neg liver ROS,   Endo/Other  negative endocrine ROS  Renal/GU negative Renal ROS     Musculoskeletal   Abdominal   Peds  Hematology  (+) anemia ,   Anesthesia Other Findings   Reproductive/Obstetrics                             Anesthesia Physical Anesthesia Plan  ASA: II  Anesthesia Plan: General   Post-op Pain Management:    Induction: Intravenous  Airway Management Planned: Oral ETT  Additional Equipment:   Intra-op Plan:   Post-operative Plan:   Informed Consent: I have reviewed the patients History and Physical, chart, labs and discussed the procedure including the risks, benefits and alternatives for the proposed anesthesia with the patient or authorized representative who has indicated his/her understanding and acceptance.     Plan Discussed with:   Anesthesia Plan Comments:         Anesthesia Quick Evaluation

## 2015-10-16 NOTE — Transfer of Care (Signed)
Immediate Anesthesia Transfer of Care Note  Patient: Sharon Solis  Procedure(s) Performed: Procedure(s): LAPAROSCOPIC CHOLECYSTECTOMY  (N/A)  Patient Location: PACU  Anesthesia Type:General  Level of Consciousness: sedated  Airway & Oxygen Therapy: Patient Spontanous Breathing  Post-op Assessment: Report given to RN and Post -op Vital signs reviewed and stable  Post vital signs: Reviewed and stable  Last Vitals:  Filed Vitals:   10/16/15 0620 10/16/15 0906  BP: 118/70 125/86  Pulse: 70 104  Temp: 37.1 C 36.3 C  Resp: 20 20    Last Pain:  Filed Vitals:   10/16/15 0910  PainSc: 7          Complications: No apparent anesthesia complications

## 2015-10-16 NOTE — Anesthesia Postprocedure Evaluation (Signed)
Anesthesia Post Note  Patient: Sharon Solis  Procedure(s) Performed: Procedure(s) (LRB): LAPAROSCOPIC CHOLECYSTECTOMY  (N/A)  Patient location during evaluation: PACU Anesthesia Type: General Level of consciousness: awake and alert Pain management: pain level controlled Vital Signs Assessment: post-procedure vital signs reviewed and stable Respiratory status: spontaneous breathing and respiratory function stable Cardiovascular status: stable Anesthetic complications: no    Last Vitals:  Filed Vitals:   10/16/15 0936 10/16/15 0941  BP: 130/81   Pulse: 71 71  Temp:    Resp: 12 14    Last Pain:  Filed Vitals:   10/16/15 0942  PainSc: 5                  KEPHART,WILLIAM K

## 2015-10-16 NOTE — Discharge Instructions (Addendum)
Take Tylenol or Norco if needed for pain.  Should not drive or do anything dangerous when taking Norco.  Should not do any heavy lifting or straining for 1 week.  Remove dressings on Wednesday. May shower Thursday and blot dry.   AMBULATORY SURGERY  DISCHARGE INSTRUCTIONS   1) The drugs that you were given will stay in your system until tomorrow so for the next 24 hours you should not:  A) Drive an automobile B) Make any legal decisions C) Drink any alcoholic beverage   2) You may resume regular meals tomorrow.  Today it is better to start with liquids and gradually work up to solid foods.  You may eat anything you prefer, but it is better to start with liquids, then soup and crackers, and gradually work up to solid foods.   3) Please notify your doctor immediately if you have any unusual bleeding, trouble breathing, redness and pain at the surgery site, drainage, fever, or pain not relieved by medication.   Please contact your physician with any problems or Same Day Surgery at [phone], Monday through Friday 6 am to 4 pm, or [place] at [facility] Main number at [phone].

## 2015-10-16 NOTE — Anesthesia Procedure Notes (Signed)
Procedure Name: Intubation Date/Time: 10/16/2015 7:40 AM Performed by: Almeta MonasFLETCHER, Abhijay Morriss Pre-anesthesia Checklist: Patient identified, Emergency Drugs available, Suction available and Patient being monitored Patient Re-evaluated:Patient Re-evaluated prior to inductionOxygen Delivery Method: Circle system utilized Preoxygenation: Pre-oxygenation with 100% oxygen Intubation Type: IV induction Ventilation: Mask ventilation without difficulty Laryngoscope Size: Mac and 3 Grade View: Grade I Tube type: Oral Number of attempts: 1 Airway Equipment and Method: Patient positioned with wedge pillow and Stylet Placement Confirmation: ETT inserted through vocal cords under direct vision,  breath sounds checked- equal and bilateral and positive ETCO2 Secured at: 21 cm Tube secured with: Tape Dental Injury: Teeth and Oropharynx as per pre-operative assessment

## 2015-10-16 NOTE — H&P (Signed)
Sharon Solis is an 25 y.o. female.   Chief Complaint: Abdominal pains HPI: She has history of epigastric pains dating back to April of last year. She also has had ultrasound of the abdomen demonstrated multiple gallstones. She reports she has had 2 pain since the day of the office visit and no other change in overall condition.  Past Medical History  Diagnosis Date  . Depression   . Headache     H/O  . Anemia     H/O  . Family history of adverse reaction to anesthesia     PTS MOM IS HARD TO WAKE UP    Past Surgical History  Procedure Laterality Date  . No past surgeries      History reviewed. No pertinent family history. Social History:  reports that she has never smoked. She does not have any smokeless tobacco history on file. She reports that she does not drink alcohol or use illicit drugs.  Allergies: No Known Allergies  Medications Prior to Admission  Medication Sig Dispense Refill  . buPROPion (WELLBUTRIN) 100 MG tablet Take 100 mg by mouth every morning.    . cholecalciferol (VITAMIN D) 1000 units tablet Take 1,000 Units by mouth daily.    Marland Kitchen. ibuprofen (ADVIL,MOTRIN) 800 MG tablet Take 1 tablet (800 mg total) by mouth every 8 (eight) hours as needed. 15 tablet 0    Results for orders placed or performed during the hospital encounter of 10/16/15 (from the past 48 hour(s))  Pregnancy, urine POC     Status: None   Collection Time: 10/16/15  6:20 AM  Result Value Ref Range   Preg Test, Ur NEGATIVE NEGATIVE    Comment:        THE SENSITIVITY OF THIS METHODOLOGY IS >24 mIU/mL    No results found.  ROS she reports minor soreness in her throat but no fever. She reports no difficulties breathing, no other new illnesses the day of the office visit  Blood pressure 118/70, pulse 70, temperature 98.7 F (37.1 C), temperature source Oral, resp. rate 20, height 5\' 6"  (1.676 m), weight 99.791 kg (220 lb), last menstrual period 09/24/2015, SpO2 99 %. Physical Exam  GENERAL:   Awake alert and oriented and in no acute distress.  HEENT:  Head is normocephalic.  Pupils are equal reactive to light.  Extraocular movements are intact. Sclera is clear.  Pharynx is clear.  LUNGS:  Clear without rales rhonchi or wheezes.  HEART:  Regular rhythm S1-S2, without murmur.  ABDOMEN: Moderately obese soft and nontender with no palpable mass.  Assessment/Plan Chronic cholecystitis cholelithiasis  I discussed the plan for laparoscopic cholecystectomy  Sharon RollsWilton Smith, MD 10/16/2015, 7:23 AM

## 2015-10-16 NOTE — Op Note (Signed)
OPERATIVE REPORT  PREOPERATIVE DIAGNOSIS:  Chronic cholecystitis cholelithiasis  POSTOPERATIVE DIAGNOSIS: Chronic cholecystitis cholelithiasis  PROCEDURE: Laparoscopic cholecystectomy  ANESTHESIA: General  SURGEON: Sharon Solis M.D.  INDICATIONS: She has a history of epigastric pains and ultrasound findings of gallstones and surgery was recommended for definitive treatment.  With the patient on the operating table in the supine position under general endotracheal anesthesia the abdomen was prepared with ChloraPrep solution and draped in a sterile manner. A short incision was made in the inferior aspect of the umbilicus and carried down to the deep fascia which was grasped with a laryngeal hook. A Veress needle was inserted aspirated and irrigated with a saline solution. The peritoneal cavity was insufflated with carbon dioxide. The Veress needle was removed. The 10 mm cannula was inserted. The 10 mm 0 laparoscope was inserted to view the peritoneal cavity.  Another incision was made in the epigastrium slightly to the right of the midline to introduce an 11 mm cannula. 2 incisions were made in the lateral aspect of the right upper quadrant to introduce 2   5 mm cannulas. Initial inspection revealed a small amount of gaseous distention of the stomach. The liver appeared smooth. The gallbladder was identified. There were no other findings and brief survey of the abdomen.  The gallbladder was retracted towards the right shoulder.  The gallbladder neck was retracted inferiorly and laterally.  The porta hepatis was identified. The gallbladder was mobilized with incision of the visceral peritoneum. The cystic duct was dissected free from surrounding structures. The cystic artery was dissected free from surrounding structures. A critical view of safety was demonstrated  An Endo Clip was placed across the cystic duct adjacent to the gallbladder neck. An incision was made in the cystic duct to introduce a  Reddick catheter however the Reddick catheter would not thread into the cystic duct. Therefore a cholangiogram was not done. The Reddick catheter was removed. The cystic duct was doubly ligated with endoclips and divided. The cystic artery was controlled with double endoclips and divided. The gallbladder was dissected free from the liver with use of hook and cautery and blunt dissection. Bleeding was minimal and hemostasis was intact. The gallbladder was delivered up through the infraumbilical incision opened and suctioned.  Multiple stones were removed with the stone forceps and with the stone scoop. The gallbladder was subsequently delivered up out of the abdomen and was submitted with stones in formalin for routine pathology. The skin incisions were closed with interrupted 5-0 chromic subcutaneous suture benzoin and Steri-Strips. Gauze dressings were applied with paper tape.  The patient appeared to be in satisfactory condition and was prepared for transfer to the recovery room  Sharon Solis M.D.

## 2015-10-17 LAB — SURGICAL PATHOLOGY

## 2015-11-15 ENCOUNTER — Emergency Department
Admission: EM | Admit: 2015-11-15 | Discharge: 2015-11-15 | Disposition: A | Discharge status: Home / Self Care | Payer: Medicaid Other | Attending: Student in an Organized Health Care Education/Training Program | Admitting: Student in an Organized Health Care Education/Training Program

## 2015-11-15 ENCOUNTER — Encounter: Payer: Self-pay | Admitting: Emergency Medicine

## 2015-11-15 DIAGNOSIS — R112 Nausea with vomiting, unspecified: Secondary | ICD-10-CM

## 2015-11-15 DIAGNOSIS — E860 Dehydration: Secondary | ICD-10-CM | POA: Insufficient documentation

## 2015-11-15 DIAGNOSIS — R197 Diarrhea, unspecified: Secondary | ICD-10-CM

## 2015-11-15 DIAGNOSIS — Z791 Long term (current) use of non-steroidal anti-inflammatories (NSAID): Secondary | ICD-10-CM | POA: Insufficient documentation

## 2015-11-15 LAB — COMPREHENSIVE METABOLIC PANEL
ALBUMIN: 4.7 g/dL (ref 3.5–5.0)
ALK PHOS: 89 U/L (ref 38–126)
ALT: 24 U/L (ref 14–54)
AST: 25 U/L (ref 15–41)
Anion gap: 10 (ref 5–15)
BILIRUBIN TOTAL: 0.7 mg/dL (ref 0.3–1.2)
BUN: 9 mg/dL (ref 6–20)
CHLORIDE: 104 mmol/L (ref 101–111)
CO2: 21 mmol/L — AB (ref 22–32)
Calcium: 9.3 mg/dL (ref 8.9–10.3)
Creatinine, Ser: 0.6 mg/dL (ref 0.44–1.00)
GFR calc Af Amer: >60 mL/min (ref >60)
GFR calc non Af Amer: >60 mL/min (ref >60)
Glucose, Bld: 116 mg/dL — ABNORMAL HIGH (ref 65–99)
POTASSIUM: 3.6 mmol/L (ref 3.5–5.1)
SODIUM: 135 mmol/L (ref 135–145)
Total Protein: 8.1 g/dL (ref 6.5–8.1)

## 2015-11-15 LAB — CBC
HEMATOCRIT: 41.4 % (ref 35.0–47.0)
HEMOGLOBIN: 15.2 g/dL (ref 12.0–16.0)
MCH: 30.8 pg (ref 26.0–34.0)
MCHC: 36.8 g/dL — ABNORMAL HIGH (ref 32.0–36.0)
MCV: 83.6 fL (ref 80.0–100.0)
PLATELETS: 227 10*3/uL (ref 150–440)
RBC: 4.95 MIL/uL (ref 3.80–5.20)
RDW: 13.2 % (ref 11.5–14.5)
WBC: 7.1 10*3/uL (ref 3.6–11.0)

## 2015-11-15 LAB — LIPASE, BLOOD: Lipase: 17 U/L (ref 11–51)

## 2015-11-15 MED ORDER — PROMETHAZINE HCL 25 MG/ML IJ SOLN
12.5000 mg | Freq: Once | INTRAMUSCULAR | Status: AC
  Administered 2015-11-15: 12.5 mg via INTRAVENOUS
  Filled 2015-11-15: qty 1

## 2015-11-15 MED ORDER — PROMETHAZINE HCL 12.5 MG PO TABS: 12.5000 mg | ORAL_TABLET | Freq: Four times a day (QID) | ORAL | 0 refills | Status: DC | PRN

## 2015-11-15 MED ORDER — SODIUM CHLORIDE 0.9 % IV BOLUS (SEPSIS)
1000.0000 mL | Freq: Once | INTRAVENOUS | Status: AC
  Administered 2015-11-15: 1000 mL via INTRAVENOUS

## 2015-11-15 NOTE — ED Triage Notes (Signed)
C/O n/V/D since Monday.  Patient states children had similar symptoms as well, but their symptoms have improved.

## 2015-11-15 NOTE — ED Provider Notes (Signed)
[facility] Emergency Department Provider Note    First MD Initiated Contact with Patient 11/15/15 1131     (approximate)  I have reviewed the triage vital signs and the nursing notes.   HISTORY  Chief Complaint Nausea; Emesis; and Diarrhea    HPI Sharon Solis is a 25 y.o. female  presents with 4 days of nausea vomiting diarrhea. Patient states that today her 2 children started with illness for 2 days before she became ill. States that her children since gotten better but she is still having loose watery stools. No blood in her stools. No bilious or bloody emesis. Denies any fevers. She took one of her sons Zofran yesterday and was able to keep fluids down but has had decreased appetite. States today she otherwise feels well but just needs fluids and she feels dehydrated. Denies any history of UC or Crohn's. She denies any abdominal pain at this time.   Past Medical History:  Diagnosis Date  . Anemia    H/O  . Depression   . Family history of adverse reaction to anesthesia    PTS MOM IS HARD TO WAKE UP  . Headache    H/O    There are no active problems to display for this patient.   Past Surgical History:  Procedure Laterality Date  . CHOLECYSTECTOMY N/A 10/16/2015   Procedure: LAPAROSCOPIC CHOLECYSTECTOMY ;  Surgeon: Nadeen Landau, MD;  Location: [facility] ORS;  Service: General;  Laterality: N/A;  . NO PAST SURGERIES      Prior to Admission medications   Medication Sig Start Date End Date Taking? Authorizing Provider  buPROPion (WELLBUTRIN) 100 MG tablet Take 100 mg by mouth every morning.    Historical Provider, MD  cholecalciferol (VITAMIN D) 1000 units tablet Take 1,000 Units by mouth daily.    Historical Provider, MD  HYDROcodone-acetaminophen (NORCO) 5-325 MG tablet Take 1-2 tablets by mouth every 4 (four) hours as needed for moderate pain. 10/16/15   Nadeen Landau, MD  ibuprofen (ADVIL,MOTRIN) 800 MG tablet Take 1 tablet (800  mg total) by mouth every 8 (eight) hours as needed. 04/21/15   Ignacia Bayley, PA-C    Allergies Review of patient's allergies indicates no known allergies.  No family history on file.  Social History Social History  Substance Use Topics  . Smoking status: Never Smoker  . Smokeless tobacco: Never Used  . Alcohol use No    Review of Systems Patient denies headaches, rhinorrhea, blurry vision, numbness, shortness of breath, chest pain, edema, cough, abdominal pain, nausea, vomiting, diarrhea, dysuria, fevers, rashes or hallucinations unless otherwise stated above in HPI. ____________________________________________   PHYSICAL EXAM:  VITAL SIGNS: Vitals:   11/15/15 1103  BP: 113/77  Pulse: 84  Resp: 18  Temp: 98.3 F (36.8 C)    Constitutional: Alert and oriented. Well appearing and in no acute distress. Eyes: Conjunctivae are normal. PERRL. EOMI. Head: Atraumatic. Nose: No congestion/rhinnorhea. Mouth/Throat: Mucous membranes are moist.  Oropharynx non-erythematous. Neck: No stridor. Painless ROM. No cervical spine tenderness to palpation Hematological/Lymphatic/Immunilogical: No cervical lymphadenopathy. Cardiovascular: Normal rate, regular rhythm. Grossly normal heart sounds.  Good peripheral circulation. Respiratory: Normal respiratory effort.  No retractions. Lungs CTAB. Gastrointestinal: Soft and nontender. No distention. No abdominal bruits. No CVA tenderness. Genitourinary:  Musculoskeletal: No lower extremity tenderness nor edema.  No joint effusions. Neurologic:  Normal speech and language. No gross focal neurologic deficits are appreciated. No gait instability. Skin:  Skin is warm, dry and intact.  No rash noted. Psychiatric: Mood and affect are normal. Speech and behavior are normal.  ____________________________________________   LABS (all labs ordered are listed, but only abnormal results are displayed)  Results for orders placed or performed during the  hospital encounter of 11/15/15 (from the past 24 hour(s))  Lipase, blood     Status: None   Collection Time: 11/15/15 11:10 AM  Result Value Ref Range   Lipase 17 11 - 51 U/L  Comprehensive metabolic panel     Status: Abnormal   Collection Time: 11/15/15 11:10 AM  Result Value Ref Range   Sodium 135 135 - 145 mmol/L   Potassium 3.6 3.5 - 5.1 mmol/L   Chloride 104 101 - 111 mmol/L   CO2 21 (L) 22 - 32 mmol/L   Glucose, Bld 116 (H) 65 - 99 mg/dL   BUN 9 6 - 20 mg/dL   Creatinine, Ser 1.610.60 0.44 - 1.00 mg/dL   Calcium 9.3 8.9 - 09.610.3 mg/dL   Total Protein 8.1 6.5 - 8.1 g/dL   Albumin 4.7 3.5 - 5.0 g/dL   AST 25 15 - 41 U/L   ALT 24 14 - 54 U/L   Alkaline Phosphatase 89 38 - 126 U/L   Total Bilirubin 0.7 0.3 - 1.2 mg/dL   GFR calc non Af Amer >60 >60 mL/min   GFR calc Af Amer >60 >60 mL/min   Anion gap 10 5 - 15  CBC     Status: Abnormal   Collection Time: 11/15/15 11:10 AM  Result Value Ref Range   WBC 7.1 3.6 - 11.0 K/uL   RBC 4.95 3.80 - 5.20 MIL/uL   Hemoglobin 15.2 12.0 - 16.0 g/dL   HCT 04.541.4 40.935.0 - 81.147.0 %   MCV 83.6 80.0 - 100.0 fL   MCH 30.8 26.0 - 34.0 pg   MCHC 36.8 (H) 32.0 - 36.0 g/dL   RDW 91.413.2 78.211.5 - 95.614.5 %   Platelets 227 150 - 440 K/uL   ____________________________________________   ____________________________________________  RADIOLOGY   ____________________________________________   PROCEDURES  Procedure(s) performed: none    Critical Care performed: no ____________________________________________   INITIAL IMPRESSION / ASSESSMENT AND PLAN / ED COURSE  Pertinent labs & imaging results that were available during my care of the patient were reviewed by me and considered in my medical decision making (see chart for details).  DDX: gastroenteritis, colitis, viral infection,   Sharon Solis is a 25 y.o. who presents to the ED with nausea vomiting diarrhea. Patient is well-appearing afebrile and hemodynamically stable. Blood work ordered  to evaluate for underlying infectious process shows mild dehydration but no other electrolyte abnormality. Based on her symptoms will provide IV Phenergan as well as IV fluids for dehydration. Her abdominal exam is soft and benign. Do not feel CT imaging clinically indicated at this time. She denies any dysuria or flank pain to suggest a UTI. Will provide prescription for antiemetics work note and referral to PCP.  Patient was able to tolerate PO and was able to ambulate with a steady gait.  Have discussed with the patient and available family all diagnostics and treatments performed thus far and all questions were answered to the best of my ability. The patient demonstrates understanding and agreement with plan.   Clinical Course     ____________________________________________   FINAL CLINICAL IMPRESSION(S) / ED DIAGNOSES  Final diagnoses:  Dehydration  Nausea vomiting and diarrhea      NEW MEDICATIONS STARTED DURING THIS VISIT:  New  Prescriptions   No medications on file     Note:  This document was prepared using Dragon voice recognition software and may include unintentional dictation errors.    Willy EddyPatrick Pau Banh, MD 11/15/15 [phone]

## 2016-09-01 ENCOUNTER — Encounter: Payer: Self-pay | Admitting: *Deleted

## 2016-09-01 ENCOUNTER — Emergency Department
Admission: EM | Admit: 2016-09-01 | Discharge: 2016-09-01 | Disposition: A | Discharge status: Home / Self Care | Payer: 59 | Attending: Emergency Medicine | Admitting: Emergency Medicine

## 2016-09-01 DIAGNOSIS — R109 Unspecified abdominal pain: Secondary | ICD-10-CM

## 2016-09-01 DIAGNOSIS — R1013 Epigastric pain: Secondary | ICD-10-CM | POA: Insufficient documentation

## 2016-09-01 DIAGNOSIS — R112 Nausea with vomiting, unspecified: Secondary | ICD-10-CM | POA: Insufficient documentation

## 2016-09-01 LAB — URINALYSIS, COMPLETE (UACMP) WITH MICROSCOPIC
Bilirubin Urine: NEGATIVE
Glucose, UA: NEGATIVE mg/dL
HGB URINE DIPSTICK: NEGATIVE
Ketones, ur: 5 mg/dL — AB
Nitrite: NEGATIVE
PROTEIN: 30 mg/dL — AB
Specific Gravity, Urine: 1.031 — ABNORMAL HIGH (ref 1.005–1.030)
pH: 5 (ref 5.0–8.0)

## 2016-09-01 LAB — COMPREHENSIVE METABOLIC PANEL
ALBUMIN: 4.4 g/dL (ref 3.5–5.0)
ALT: 20 U/L (ref 14–54)
AST: 20 U/L (ref 15–41)
Alkaline Phosphatase: 102 U/L (ref 38–126)
Anion gap: 8 (ref 5–15)
BUN: 10 mg/dL (ref 6–20)
CHLORIDE: 102 mmol/L (ref 101–111)
CO2: 27 mmol/L (ref 22–32)
Calcium: 9.2 mg/dL (ref 8.9–10.3)
Creatinine, Ser: 0.72 mg/dL (ref 0.44–1.00)
GFR calc Af Amer: >60 mL/min (ref >60)
GFR calc non Af Amer: >60 mL/min (ref >60)
GLUCOSE: 119 mg/dL — AB (ref 65–99)
POTASSIUM: 3.8 mmol/L (ref 3.5–5.1)
Sodium: 137 mmol/L (ref 135–145)
Total Bilirubin: 0.8 mg/dL (ref 0.3–1.2)
Total Protein: 7.9 g/dL (ref 6.5–8.1)

## 2016-09-01 LAB — CBC
HEMATOCRIT: 42.2 % (ref 35.0–47.0)
Hemoglobin: 15 g/dL (ref 12.0–16.0)
MCH: 30.7 pg (ref 26.0–34.0)
MCHC: 35.6 g/dL (ref 32.0–36.0)
MCV: 86.3 fL (ref 80.0–100.0)
Platelets: 254 10*3/uL (ref 150–440)
RBC: 4.89 MIL/uL (ref 3.80–5.20)
RDW: 12.7 % (ref 11.5–14.5)
WBC: 6 10*3/uL (ref 3.6–11.0)

## 2016-09-01 LAB — PREGNANCY, URINE: PREG TEST UR: NEGATIVE

## 2016-09-01 LAB — LIPASE, BLOOD: LIPASE: 21 U/L (ref 11–51)

## 2016-09-01 MED ORDER — ONDANSETRON 4 MG PO TBDP: ORAL_TABLET | ORAL | 0 refills | Status: DC

## 2016-09-01 NOTE — ED Notes (Signed)
Pt discharged to home.  Family member driving.  Discharge instructions reviewed.  Verbalized understanding.  No questions or concerns at this time.  Teach back verified.  Pt in NAD.  No items left in ED.   

## 2016-09-01 NOTE — ED Notes (Signed)
FN: [facility] with reports of low back, epigastric pain and vomiting.

## 2016-09-01 NOTE — Discharge Instructions (Signed)

## 2016-09-01 NOTE — ED Triage Notes (Signed)
States last night she developed lower back pain and abd pain with nausea with about 12 episodes of vomiting, states continued lower back pain but that her nausea is better, denies any urinary symptoms, states a week or so ago she had some bloody vaginal discharge, awake and alert in no acute distress

## 2016-09-01 NOTE — ED Provider Notes (Signed)
[facility] Emergency Department Provider Note  ____________________________________________   First MD Initiated Contact with Patient 09/01/16 1748     (approximate)  I have reviewed the triage vital signs and the nursing notes.   HISTORY  Chief Complaint Nausea; Back Pain; and Abdominal Pain    HPI Sharon Solis is a 26 y.o. female with medical history as listed below presents for evaluation ofmultiple episodes of vomiting today with some lower abdominal pain that occurred yesterday but is now resolved.  She reports that she had a little bit of bloody vaginal discharge but that was about a week ago and she has not had any additional symptoms.  She states that she felt much worse last night but the symptoms have gotten gradually better over the course of the day after she stopped vomiting.  [facility] but was sent over here for further evaluation.  She is awake, alert, oriented, and in no acute distress.  She is smiling and joking with me and has 2 of her children and her mother present.  She states that she feels much better and just wants to make sure everything looks okay.  Nothing in particular made her symptoms better nor worse.   Past Medical History:  Diagnosis Date  . Anemia    H/O  . Depression   . Family history of adverse reaction to anesthesia    PTS MOM IS HARD TO WAKE UP  . Headache    H/O    There are no active problems to display for this patient.   Past Surgical History:  Procedure Laterality Date  . CHOLECYSTECTOMY N/A 10/16/2015   Procedure: LAPAROSCOPIC CHOLECYSTECTOMY ;  Surgeon: Nadeen LandauJarvis Wilton Smith, MD;  Location: [facility] ORS;  Service: General;  Laterality: N/A;  . NO PAST SURGERIES      Prior to Admission medications   Medication Sig Start Date End Date Taking? Authorizing Provider  buPROPion (WELLBUTRIN) 100 MG tablet Take 100 mg by mouth every morning.    [provider]  cholecalciferol  (VITAMIN D) 1000 units tablet Take 1,000 Units by mouth daily.    [provider]  HYDROcodone-acetaminophen (NORCO) 5-325 MG tablet Take 1-2 tablets by mouth every 4 (four) hours as needed for moderate pain. 10/16/15   Nadeen LandauSmith, Jarvis Wilton, MD  ibuprofen (ADVIL,MOTRIN) 800 MG tablet Take 1 tablet (800 mg total) by mouth every 8 (eight) hours as needed. 04/21/15   Ignacia Bayleyumey, Robert, PA-C  ondansetron (ZOFRAN ODT) 4 MG disintegrating tablet Allow 1-2 tablets to dissolve in your mouth every 8 hours as needed for nausea/vomiting 09/01/16   Loleta RoseForbach, Liliauna Santoni, MD  promethazine (PHENERGAN) 12.5 MG tablet Take 1 tablet (12.5 mg total) by mouth every 6 (six) hours as needed for nausea or vomiting. 11/15/15   Willy Eddyobinson, Patrick, MD    Allergies Patient has no known allergies.  History reviewed. No pertinent family history.  Social History Social History  Substance Use Topics  . Smoking status: Never Smoker  . Smokeless tobacco: Never Used  . Alcohol use No    Review of Systems Constitutional: No fever/chills Eyes: No visual changes. ENT: No sore throat. Cardiovascular: Denies chest pain. Respiratory: Denies shortness of breath. Gastrointestinal: Abdominal pain mostly last night which has now mostly resolved with some residual epigastric pain.  Multiple episodes of vomiting earlier today, now resolved.  Several loose stools. Genitourinary: Negative for dysuria.  Episode of bloody vaginal discharge about a week ago, now resolved.  No additional vaginal  discharge or bleeding. Musculoskeletal: Negative for neck pain.  Negative for back pain. Integumentary: Negative for rash. Neurological: Negative for headaches, focal weakness or numbness.   ____________________________________________   PHYSICAL EXAM:  VITAL SIGNS: ED Triage Vitals  Enc Vitals Group     BP 09/01/16 1434 115/72     Pulse Rate 09/01/16 1434 97     Resp 09/01/16 1434 18     Temp 09/01/16 1434 99 F (37.2 C)     Temp Source  09/01/16 1434 Oral     SpO2 09/01/16 1434 98 %     Weight 09/01/16 1435 98 kg (216 lb)     Height 09/01/16 1435 1.676 m (5\' 6" )     Head Circumference --      Peak Flow --      Pain Score 09/01/16 1434 7     Pain Loc --      Pain Edu? --      Excl. in GC? --     Constitutional: Alert and oriented. Well appearing and in no acute distress. Eyes: Conjunctivae are normal.  Head: Atraumatic. Nose: No congestion/rhinnorhea. Mouth/Throat: Mucous membranes are moist. Neck: No stridor.  No meningeal signs.   Cardiovascular: Normal rate, regular rhythm. Good peripheral circulation. Grossly normal heart sounds. Respiratory: Normal respiratory effort.  No retractions. Lungs CTAB. Gastrointestinal: Soft with mild tenderness to the epigastrium consistent with musculoskeletal tenderness after her recent vomiting, but generally reassuring with no rebound, no guarding, and no lower abdominal tenderness to palpation Genitourinary: Deferred Musculoskeletal: No lower extremity tenderness nor edema. No gross deformities of extremities. Neurologic:  Normal speech and language. No gross focal neurologic deficits are appreciated.  Skin:  Skin is warm, dry and intact. No rash noted. Psychiatric: Mood and affect are normal. Speech and behavior are normal.  ____________________________________________   LABS (all labs ordered are listed, but only abnormal results are displayed)  Labs Reviewed  COMPREHENSIVE METABOLIC PANEL - Abnormal; Notable for the following:       Result Value   Glucose, Bld 119 (*)    All other components within normal limits  URINALYSIS, COMPLETE (UACMP) WITH MICROSCOPIC - Abnormal; Notable for the following:    Color, Urine AMBER (*)    APPearance HAZY (*)    Specific Gravity, Urine 1.031 (*)    Ketones, ur 5 (*)    Protein, ur 30 (*)    Leukocytes, UA TRACE (*)    Bacteria, UA RARE (*)    Squamous Epithelial / LPF 0-5 (*)    All other components within normal limits    LIPASE, BLOOD  CBC  PREGNANCY, URINE   ____________________________________________  EKG  None - EKG not ordered by ED physician ____________________________________________  RADIOLOGY   No results found.  ____________________________________________   PROCEDURES  Critical Care performed: No   Procedure(s) performed:   Procedures   ____________________________________________   INITIAL IMPRESSION / ASSESSMENT AND PLAN / ED COURSE  Pertinent labs & imaging results that were available during my care of the patient were reviewed by me and considered in my medical decision making (see chart for details).  Labs are unremarkable and reassuring.  The patient states without me prompting her that she feels much better.  No indication of acute abnormality on her lab work.  I believe that she has some residual epigastric tenderness due to all the vomiting she did earlier but she is no longer nauseated and no longer reporting any pain.  We discussed at bedside there is no  indication for any further workup at this time.  I gave my usual and customary return precautions.         ____________________________________________  FINAL CLINICAL IMPRESSION(S) / ED DIAGNOSES  Final diagnoses:  Non-intractable vomiting with nausea, unspecified vomiting type  Abdominal pain, unspecified abdominal location     MEDICATIONS GIVEN DURING THIS VISIT:  Medications - No data to display   NEW OUTPATIENT MEDICATIONS STARTED DURING THIS VISIT:  Discharge Medication List as of 09/01/2016  6:54 PM    START taking these medications   Details  ondansetron (ZOFRAN ODT) 4 MG disintegrating tablet Allow 1-2 tablets to dissolve in your mouth every 8 hours as needed for nausea/vomiting, Print        Discharge Medication List as of 09/01/2016  6:54 PM      Discharge Medication List as of 09/01/2016  6:54 PM       Note:  This document was prepared using Dragon voice recognition software  and may include unintentional dictation errors.    Loleta Rose, MD 09/01/16 2114

## 2018-03-31 NOTE — L&D Delivery Note (Addendum)
OB/GYN Faculty Practice Delivery Note  Sharon Solis is a 28 y.o. [id number] s/p vaginal delivery at [redacted]w[redacted]d. She was admitted for IOL 2/2 A2GDM.   ROM: 2h 47m with clear fluid GBS Status: negative Maximum Maternal Temperature: 98.2 F  Labor Progress: Patient was admitted and induction started with cytotec. She was then started on Pitocin. She was AROMed and then progressed to complete.  Delivery Date/Time: 03/30/2019, 1135 hours Delivery: Called to room and patient was complete and pushing. Head delivered ROA. No nuchal cord present. Shoulder and body delivered in usual fashion. Infant with spontaneous cry, placed on mother's abdomen, dried and stimulated. Cord clamped x 2 after 1-minute delay, and cut by FOB under my direct supervision. Cord blood drawn. Placenta delivered spontaneously with gentle cord traction. Fundus firm with massage and Pitocin. Labia, perineum, vagina, and cervix were inspected, bilateral periurethral tears were noted- repaired with Vicryl Rapide in the usual fashion.   Placenta: 3 vessel cord, intact, to L&D Complications: None Lacerations: bilateral periurethral, repaired with Vicryl Rapide in the usual fashion QBL: 179 Analgesia: epidural  Postpartum Planning [x]  message to sent to schedule follow-up  [x]  vaccines UTD  Infant: female  APGARs 9&9  weight per medical record  [name], DO OB/GYN Fellow, Faculty Practice

## 2018-06-15 ENCOUNTER — Telehealth: Payer: 59 | Admitting: Physician Assistant

## 2018-06-15 DIAGNOSIS — R05 Cough: Secondary | ICD-10-CM

## 2018-06-15 DIAGNOSIS — R059 Cough, unspecified: Secondary | ICD-10-CM

## 2018-06-15 DIAGNOSIS — R0602 Shortness of breath: Secondary | ICD-10-CM

## 2018-06-15 DIAGNOSIS — R509 Fever, unspecified: Secondary | ICD-10-CM

## 2018-06-15 NOTE — Progress Notes (Deleted)
E-Visit for [employer] Virus Screening  Based on what you have shared with me, I feel your condition warrants further evaluation for Corona virus and I recommend that you be seen and evaluated "face to face". Our Emergency Departments are best equipped to handle patients with potential Corona Virus. I recommend the following:  . If you are having a true medical emergency please call 911. . Since you are considered high risk for Corona virus virus because of a known exposure, fever, shortness of breath and cough, OR if you have severe symptoms of any kind, seek medical care at an emergency room.  . Please call ahead and tell them that you were seen by telemedicine and they have recommended that you have a face to face evaluation for Corona virus.  [facility] Emergency Department [address] [place], [place], [place] [address] [phone]  . [facility] Emergency Department [address] [place], [place] [address] [phone]  . [facility] Emergency Department [address] [place], [place], [place] [address] [phone]  . [facility] Emergency Department [address] [place], [place], [place] [address] [phone]  . [facility] Emergency Department [address] [place], [place], [place] [address] [phone]  NOTE: If you entered your credit card information for this eVisit, you will not be charged. You may see a "hold" on your card for the $35 but that hold will drop off and you will not have a charge processed.   Your e-visit answers were reviewed by a board certified advanced clinical practitioner to complete your personal care plan.  Thank you for using e-Visits.

## 2018-06-16 ENCOUNTER — Telehealth: Payer: 59 | Admitting: Family

## 2018-06-16 ENCOUNTER — Telehealth: Payer: Self-pay | Admitting: *Deleted

## 2018-06-16 DIAGNOSIS — R05 Cough: Secondary | ICD-10-CM

## 2018-06-16 DIAGNOSIS — R6889 Other general symptoms and signs: Secondary | ICD-10-CM

## 2018-06-16 DIAGNOSIS — R059 Cough, unspecified: Secondary | ICD-10-CM

## 2018-06-16 DIAGNOSIS — R0602 Shortness of breath: Secondary | ICD-10-CM

## 2018-06-16 MED ORDER — BENZONATATE 100 MG PO CAPS: 100.0000 mg | ORAL_CAPSULE | Freq: Three times a day (TID) | ORAL | 0 refills | Status: DC | PRN

## 2018-06-16 NOTE — Addendum Note (Signed)
Addended by: Ofilia Neas on: 06/16/2018 08:45 AM   Modules accepted: Orders

## 2018-06-16 NOTE — Progress Notes (Signed)
It looks like you did an Evisit yesterday. You can go get tested and self isolate for the next 14 days!!   Approximately 5 minutes was spent documenting and reviewing patient's chart.    Based on what you shared with me, I feel that you are considered high risk for Corona virus virus because of a known exposure, fever, shortness of breath and cough.  You should proceed to our testing site at [address] [place] [address].   I have placed an order for you to have the cornoavirus (COVID19) test done.  - You will be tested for (COVID-19) and discharged home on quarantine except to seek medical care if symptoms worsen, and asked to  - Stay home and avoid contact with others until you get your results (4-5 days)  - Avoid travel on public transportation if possible (such as bus, train, or airplane)  Continue to monitor at home and seek medical attention if your symptoms worsen.  If you are having a medical emergency, call 911.  You can use medications such as A prescription cough medication called Tessalon Perles 100 mg. You may take 1-2 capsules every 8 hours as needed for cough  Please review the forms below as these are required. PRINT, sign and complete and bring with you if possible to the testing site.     Person Under Monitoring Name: Sharon Solis  Location: [address] [place] [address]   CORONAVIRUS DISEASE 2019 (COVID-19) Guidance for Persons Under Investigation You are being tested for the virus that causes coronavirus disease 2019 (COVID-19). Public health actions are necessary to ensure protection of your health and the health of others, and to prevent further spread of infection. COVID-19 is caused by a virus that can cause symptoms, such as fever, cough, and shortness of breath. The primary transmission from person to person is by coughing or sneezing. On April 29, 2018, the World Health Organization announced a [employer] Emergency of  International Concern and on April 30, 2018 the U.S. Department of Health and Human Services declared a public health emergency. If the virus that causesCOVID-19 spreads in the community, it could have severe public health consequences.  As a person under investigation for COVID-19, the [employer] of [employer] and [employer], Division of [employer] advises you to adhere to the following guidance until your test results are reported to you. If your test result is positive, you will receive additional information from your provider and your local health department at that time.   Remain at home until you are cleared by your health provider or public health authorities.   Keep a log of visitors to your home using the form provided. Any visitors to your home must be aware of your isolation status.  If you plan to move to a new address or leave the county, notify the local health department in your county.  Call a doctor or seek care if you have an urgent medical need. Before seeking medical care, call ahead and get instructions from the provider before arriving at the medical office, clinic or hospital. Notify them that you are being tested for the virus that causes COVID-19 so arrangements can be made, as necessary, to prevent transmission to others in the healthcare setting. Next, notify the local health department in your county.  If a medical emergency arises and you need to call 911, inform the first responders that you are being tested for the virus that causes COVID-19.  Next, notify the local health department in your county.  Adhere to all guidance set forth by the [facility] Division of [employer] for [facility] of patients that is based on guidance from the Center for Disease Control and Prevention with suspected or confirmed COVID-19. It is provided with this guidance for Persons Under Investigation.  Your health and the health of our community are our top  priorities. Public Health officials remain available to provide assistance and counseling to you about COVID-19 and compliance with this guidance.  Provider: ____________________________________________________________ Date: ______/_____/_________  By signing below, you acknowledge that you have read and agree to comply with this Guidance for Persons Under Investigation. ______________________________________________________________ Date: ______/_____/_________  WHO DO I CALL? You can find a list of local health departments here: [email or web address] Health Department: ____________________________________________________________________ Contact Name: ________________________________________________________________________ Telephone: ___________________________________________________________________________  Nedra Hai, Division of Public Health, Communicable Disease Branch COVID-19 Guidance for Persons Under Investigation June 05, 2018   Person Under Monitoring Name: Sharon Solis  Location: [address] [place] [address]   Record here the list of visitors to your home since you became ill with respiratory symptoms that led you to consult a health provider:  Visitor Name Date Time In Time Out Did this person come within 6 feet of you? Indicate Y or N Relationship to Person Under Monitoring Phone number Comments   ___/____/____ __:__ AM/PM __:__ AM/PM       ___/____/____ __:__ AM/PM __:__ AM/PM       ___/____/____ __:__ AM/PM __:__ AM/PM       ___/____/____ __:__ AM/PM __:__ AM/PM       ___/____/____ __:__ AM/PM __:__ AM/PM       ___/____/____ __:__ AM/PM __:__ AM/PM       ___/____/____ __:__ AM/PM __:__ AM/PM       ___/____/____ __:__ AM/PM __:__ AM/PM       ___/____/____ __:__ AM/PM __:__ AM/PM       ___/____/____ __:__ AM/PM __:__ AM/PM       ___/____/____ __:__ AM/PM __:__ AM/PM        ___/____/____ __:__ AM/PM __:__ AM/PM       ___/____/____ __:__ AM/PM __:__ AM/PM       ___/____/____ __:__ AM/PM __:__ AM/PM       Nedra Hai, Division of Public Health, Communicable Disease Branch

## 2018-06-16 NOTE — Progress Notes (Signed)
Based on what you shared with me, I feel that you are considered high risk for Corona virus virus because of a known exposure, fever, shortness of breath and cough.  You should proceed to our testing site at [address] [place] [address].   I have placed an order for you to have the cornoavirus (COVID19) test done.  - You will be tested for (COVID-19) and discharged home on quarantine except to seek medical care if symptoms worsen, and asked to  - Stay home and avoid contact with others until you get your results (4-5 days)  - Avoid travel on public transportation if possible (such as bus, train, or airplane)  Continue to monitor at home and seek medical attention if your symptoms worsen.  If you are having a medical emergency, call 911.  Please review the forms below as these are required. PRINT, sign and complete and bring with you if possible to the testing site.     Person Under Monitoring Name: Sharon Solis  Location: [address] [place] [address]   CORONAVIRUS DISEASE 2019 (COVID-19) Guidance for Persons Under Investigation You are being tested for the virus that causes coronavirus disease 2019 (COVID-19). Public health actions are necessary to ensure protection of your health and the health of others, and to prevent further spread of infection. COVID-19 is caused by a virus that can cause symptoms, such as fever, cough, and shortness of breath. The primary transmission from person to person is by coughing or sneezing. On April 29, 2018, the World Health Organization announced a [employer] Emergency of International Concern and on April 30, 2018 the U.S. Department of Health and Human Services declared a public health emergency. If the virus that causesCOVID-19 spreads in the community, it could have severe public health consequences.  As a person under investigation for COVID-19, the [employer] of [employer], Division  of [employer] advises you to adhere to the following guidance until your test results are reported to you. If your test result is positive, you will receive additional information from your provider and your local health department at that time.   Remain at home until you are cleared by your health provider or public health authorities.   Keep a log of visitors to your home using the form provided. Any visitors to your home must be aware of your isolation status.  If you plan to move to a new address or leave the county, notify the local health department in your county.  Call a doctor or seek care if you have an urgent medical need. Before seeking medical care, call ahead and get instructions from the provider before arriving at the medical office, clinic or hospital. Notify them that you are being tested for the virus that causes COVID-19 so arrangements can be made, as necessary, to prevent transmission to others in the healthcare setting. Next, notify the local health department in your county.  If a medical emergency arises and you need to call 911, inform the first responders that you are being tested for the virus that causes COVID-19. Next, notify the local health department in your county.  Adhere to all guidance set forth by the [facility] Division of [employer] for [facility] of patients that is based on guidance from the Center for Disease Control and Prevention with suspected or confirmed COVID-19. It is provided with this guidance for Persons Under Investigation.  Your health and the health of  our community are our top priorities. Public Health officials remain available to provide assistance and counseling to you about COVID-19 and compliance with this guidance.  Provider: Deliah Boston, MS, PA-C 8:37 AM, 06/16/2018   By signing below, you acknowledge that you have read and agree to comply with this Guidance for Persons Under  Investigation. ______________________________________________________________ Date: ______/_____/_________  WHO DO I CALL? You can find a list of local health departments here: [email or web address] Health Department: ____________________________________________________________________ Contact Name: ________________________________________________________________________ Telephone: ___________________________________________________________________________  Nedra Hai, Division of Public Health, Communicable Disease Branch COVID-19 Guidance for Persons Under Investigation June 05, 2018   Person Under Monitoring Name: Sharon Solis  Location: [address] [place] [address]   Record here the list of visitors to your home since you became ill with respiratory symptoms that led you to consult a health provider:  Visitor Name Date Time In Time Out Did this person come within 6 feet of you? Indicate Y or N Relationship to Person Under Monitoring Phone number Comments   ___/____/____ __:__ AM/PM __:__ AM/PM       ___/____/____ __:__ AM/PM __:__ AM/PM       ___/____/____ __:__ AM/PM __:__ AM/PM       ___/____/____ __:__ AM/PM __:__ AM/PM       ___/____/____ __:__ AM/PM __:__ AM/PM       ___/____/____ __:__ AM/PM __:__ AM/PM       ___/____/____ __:__ AM/PM __:__ AM/PM       ___/____/____ __:__ AM/PM __:__ AM/PM       ___/____/____ __:__ AM/PM __:__ AM/PM       ___/____/____ __:__ AM/PM __:__ AM/PM       ___/____/____ __:__ AM/PM __:__ AM/PM       ___/____/____ __:__ AM/PM __:__ AM/PM       ___/____/____ __:__ AM/PM __:__ AM/PM       ___/____/____ __:__ AM/PM __:__ AM/PM       [facility], Division of Public Health, Communicable Disease Branch  A total of 5-10 minutes was spent evaluating this patients questionnaire and formulating a plan of care.

## 2018-06-16 NOTE — Progress Notes (Signed)
Based on what you shared with me, I feel that you are considered high risk for Corona virus virus because of a known exposure, fever, shortness of breath and cough.  You should proceed to our testing site at [address] [place] [address].   I have placed an order for you to have the cornoavirus (COVID19) test done.  - You will be tested for (COVID-19) and discharged home on quarantine except to seek medical care if symptoms worsen, and asked to  - Stay home and avoid contact with others until you get your results (4-5 days)  - Avoid travel on public transportation if possible (such as bus, train, or airplane)  Continue to monitor at home and seek medical attention if your symptoms worsen.  If you are having a medical emergency, call 911.  Please review the forms below as these are required. PRINT, sign and complete and bring with you if possible to the testing site.     Person Under Monitoring Name: Sharon Solis  Location: [address] [place] [address]   CORONAVIRUS DISEASE 2019 (COVID-19) Guidance for Persons Under Investigation You are being tested for the virus that causes coronavirus disease 2019 (COVID-19). Public health actions are necessary to ensure protection of your health and the health of others, and to prevent further spread of infection. COVID-19 is caused by a virus that can cause symptoms, such as fever, cough, and shortness of breath. The primary transmission from person to person is by coughing or sneezing. On April 29, 2018, the World Health Organization announced a [employer] Emergency of International Concern and on April 30, 2018 the U.S. Department of Health and Human Services declared a public health emergency. If the virus that causesCOVID-19 spreads in the community, it could have severe public health consequences.  As a person under investigation for COVID-19, the [employer] of Health and [employer], Division  of [employer] advises you to adhere to the following guidance until your test results are reported to you. If your test result is positive, you will receive additional information from your provider and your local health department at that time.   Remain at home until you are cleared by your health provider or public health authorities.   Keep a log of visitors to your home using the form provided. Any visitors to your home must be aware of your isolation status.  If you plan to move to a new address or leave the county, notify the local health department in your county.  Call a doctor or seek care if you have an urgent medical need. Before seeking medical care, call ahead and get instructions from the provider before arriving at the medical office, clinic or hospital. Notify them that you are being tested for the virus that causes COVID-19 so arrangements can be made, as necessary, to prevent transmission to others in the healthcare setting. Next, notify the local health department in your county.  If a medical emergency arises and you need to call 911, inform the first responders that you are being tested for the virus that causes COVID-19. Next, notify the local health department in your county.  Adhere to all guidance set forth by the [facility] Division of [employer] for [facility] of patients that is based on guidance from the Center for Disease Control and Prevention with suspected or confirmed COVID-19. It is provided with this guidance for Persons Under Investigation.  Your health and the health of  our community are our top priorities. Public Health officials remain available to provide assistance and counseling to you about COVID-19 and compliance with this guidance.  Provider: Deliah Boston, MS, PA-C 8:37 AM, 06/16/2018   By signing below, you acknowledge that you have read and agree to comply with this Guidance for Persons Under Investigation.  ______________________________________________________________ Date: ______/_____/_________  WHO DO I CALL? You can find a list of local health departments here: [email or web address] Health Department: ____________________________________________________________________ Contact Name: ________________________________________________________________________ Telephone: ___________________________________________________________________________  Nedra Hai, Division of Public Health, Communicable Disease Branch COVID-19 Guidance for Persons Under Investigation June 05, 2018   Person Under Monitoring Name: Sharon Solis  Location: [address] [place] [address]   Record here the list of visitors to your home since you became ill with respiratory symptoms that led you to consult a health provider:  Visitor Name Date Time In Time Out Did this person come within 6 feet of you? Indicate Y or N Relationship to Person Under Monitoring Phone number Comments   ___/____/____ __:__ AM/PM __:__ AM/PM       ___/____/____ __:__ AM/PM __:__ AM/PM       ___/____/____ __:__ AM/PM __:__ AM/PM       ___/____/____ __:__ AM/PM __:__ AM/PM       ___/____/____ __:__ AM/PM __:__ AM/PM       ___/____/____ __:__ AM/PM __:__ AM/PM       ___/____/____ __:__ AM/PM __:__ AM/PM       ___/____/____ __:__ AM/PM __:__ AM/PM       ___/____/____ __:__ AM/PM __:__ AM/PM       ___/____/____ __:__ AM/PM __:__ AM/PM       ___/____/____ __:__ AM/PM __:__ AM/PM       ___/____/____ __:__ AM/PM __:__ AM/PM       ___/____/____ __:__ AM/PM __:__ AM/PM       ___/____/____ __:__ AM/PM __:__ AM/PM       Nedra Hai, Division of Public Health, Communicable Disease Branch

## 2018-06-22 LAB — NOVEL CORONAVIRUS, NAA: SARS-CoV-2, NAA: NOT DETECTED

## 2018-08-04 ENCOUNTER — Telehealth: Payer: 59 | Admitting: Family

## 2018-08-04 DIAGNOSIS — M5441 Lumbago with sciatica, right side: Secondary | ICD-10-CM

## 2018-08-04 MED ORDER — BACLOFEN 10 MG PO TABS: 10.0000 mg | ORAL_TABLET | Freq: Three times a day (TID) | ORAL | 0 refills | Status: DC

## 2018-08-04 MED ORDER — NAPROXEN 500 MG PO TABS: 500.0000 mg | ORAL_TABLET | Freq: Two times a day (BID) | ORAL | 0 refills | Status: DC

## 2018-08-04 NOTE — Progress Notes (Signed)
We are sorry that you are not feeling well.  Here is how we plan to help!  Based on what you have shared with me it looks like you mostly have acute back pain.  Acute back pain is defined as musculoskeletal pain that can resolve in 1-3 weeks with conservative treatment.  I have prescribed Naprosyn 500 mg twice a day non-steroid anti-inflammatory (NSAID) as well as Baclofen 10 mg every eight hours as needed which is a muscle relaxer  Some patients experience stomach irritation or in increased heartburn with anti-inflammatory drugs.  Please keep in mind that muscle relaxer's can cause fatigue and should not be taken while at work or driving.  Back pain is very common.  The pain often gets better over time.  The cause of back pain is usually not dangerous.  Most people can learn to manage their back pain on their own.   Try theses medications for a few days, if the pain worsens or does not improve you need to follow up face to face to be evaluated.  Approximately 5 minutes was spent documenting and reviewing patient's chart.     Home Care  Stay active.  Start with short walks on flat ground if you can.  Try to walk farther each day.  Do not sit, drive or stand in one place for more than 30 minutes.  Do not stay in bed.  Do not avoid exercise or work.  Activity can help your back heal faster.  Be careful when you bend or lift an object.  Bend at your knees, keep the object close to you, and do not twist.  Sleep on a firm mattress.  Lie on your side, and bend your knees.  If you lie on your back, put a pillow under your knees.  Only take medicines as told by your doctor.  Put ice on the injured area.  Put ice in a plastic bag  Place a towel between your skin and the bag  Leave the ice on for 15-20 minutes, 3-4 times a day for the first 2-3 days. 210 After that, you can switch between ice and heat packs.  Ask your doctor about back exercises or massage.  Avoid feeling anxious or  stressed.  Find good ways to deal with stress, such as exercise.  Get Help Right Way If:  Your pain does not go away with rest or medicine.  Your pain does not go away in 1 week.  You have new problems.  You do not feel well.  The pain spreads into your legs.  You cannot control when you poop (bowel movement) or pee (urinate)  You feel sick to your stomach (nauseous) or throw up (vomit)  You have belly (abdominal) pain.  You feel like you may pass out (faint).  If you develop a fever.  Make Sure you:  Understand these instructions.  Will watch your condition  Will get help right away if you are not doing well or get worse.  Your e-visit answers were reviewed by a board certified advanced clinical practitioner to complete your personal care plan.  Depending on the condition, your plan could have included both over the counter or prescription medications.  If there is a problem please reply  once you have received a response from your provider.  Your safety is important to [place].  If you have drug allergies check your prescription carefully.    You can use MyChart to ask questions about today's visit, request a  non-urgent call back, or ask for a work or school excuse for 24 hours related to this e-Visit. If it has been greater than 24 hours you will need to follow up with your provider, or enter a new e-Visit to address those concerns.  You will get an e-mail in the next two days asking about your experience.  I hope that your e-visit has been valuable and will speed your recovery. Thank you for using e-visits.

## 2018-08-09 ENCOUNTER — Emergency Department (HOSPITAL_COMMUNITY)
Admission: EM | Admit: 2018-08-09 | Discharge: 2018-08-09 | Disposition: A | Discharge status: Home / Self Care | Payer: No Typology Code available for payment source | Attending: Emergency Medicine | Admitting: Emergency Medicine

## 2018-08-09 ENCOUNTER — Encounter (HOSPITAL_COMMUNITY): Payer: Self-pay

## 2018-08-09 ENCOUNTER — Other Ambulatory Visit: Payer: Self-pay

## 2018-08-09 DIAGNOSIS — Z79899 Other long term (current) drug therapy: Secondary | ICD-10-CM | POA: Diagnosis not present

## 2018-08-09 DIAGNOSIS — M5441 Lumbago with sciatica, right side: Secondary | ICD-10-CM | POA: Insufficient documentation

## 2018-08-09 DIAGNOSIS — Z87891 Personal history of nicotine dependence: Secondary | ICD-10-CM | POA: Diagnosis not present

## 2018-08-09 DIAGNOSIS — M5489 Other dorsalgia: Secondary | ICD-10-CM | POA: Diagnosis present

## 2018-08-09 DIAGNOSIS — M5440 Lumbago with sciatica, unspecified side: Secondary | ICD-10-CM

## 2018-08-09 LAB — I-STAT BETA HCG BLOOD, ED (MC, WL, AP ONLY): I-stat hCG, quantitative: >2000 m[IU]/mL — ABNORMAL HIGH (ref <5)

## 2018-08-09 MED ORDER — LIDOCAINE 5 % EX PTCH: 1.0000 | MEDICATED_PATCH | CUTANEOUS | 0 refills | Status: DC

## 2018-08-09 MED ORDER — LIDOCAINE 5 % EX PTCH
1.0000 | MEDICATED_PATCH | CUTANEOUS | Status: DC
  Administered 2018-08-09: 1 via TRANSDERMAL
  Filled 2018-08-09: qty 1

## 2018-08-09 MED ORDER — ACETAMINOPHEN 500 MG PO TABS
1000.0000 mg | ORAL_TABLET | Freq: Once | ORAL | Status: AC
Start: 2018-08-09 — End: 2018-08-09
  Administered 2018-08-09: 23:00:00 1000 mg via ORAL
  Filled 2018-08-09: qty 2

## 2018-08-09 NOTE — ED Triage Notes (Signed)
Pt from home w/ a c/o lower back pain that hurts worse on the right side and radiates down her right leg. She reported twisting her back while moving about one week ago. Pt has had sciatic pain in the past and states it feels the same. The pain radiates into a tingling feeling down her leg. Pt did an e-visit and was prescribed Baclofen and Naprosyn. She did not take any of the Naprosyn bc there was a warning with possible pregnancy. Pt has a positive pregnancy test.

## 2018-08-09 NOTE — ED Notes (Signed)
Patient verbalizes understanding of discharge instructions. Opportunity for questioning and answers were provided. Armband removed by staff, pt discharged from ED.  

## 2018-08-09 NOTE — Discharge Instructions (Addendum)
Take the prescribed medication as directed.  Can take tylenol (up to 2000mg  daily safely).  Can also use heat/ice, stretching, etc. I would start taking some prenatal vitamins. Follow-up with an OB-GYN in the area.  I have given information for women's clinic if needed. Return to the ED for new or worsening symptoms-- numbness/weakness of the legs, bowel or bladder incontinence, etc.

## 2018-08-09 NOTE — ED Provider Notes (Signed)
[facility] EMERGENCY DEPARTMENT Provider Note   CSN: [id number] Arrival date & time: 08/09/18  2143    History   Chief Complaint Chief Complaint  Patient presents with  . Back Pain    HPI SULTANA STOCKHAUSEN is a 28 y.o. female.     The history is provided by the patient and medical records.  Back Pain     28 y.o. F with hx of anemia, depression, headaches, presenting to the ED for right sided back pain.  Patient reports she moved into a new house last week and felt her back "twist" during this.  She states she has history of sciatica in the past and this feels the same.  Pain begins in right lower back, radiates into right buttock, and into right posterior thigh.  There is no numbness/weakness of the legs.  No bowel or bladder incontinence.  Pain better with standing upright, worst with sitting down.  States she did an e-visit with a doctor recently and was prescribed baclofen and naprosyn-- she only took 1 dose of the baclofen before realizing she was pregnant.  She had 2 positive home tests.  LMP was about 4-5 weeks ago so maybe slightly late.  Past Medical History:  Diagnosis Date  . Anemia    H/O  . Depression   . Family history of adverse reaction to anesthesia    PTS MOM IS HARD TO WAKE UP  . Headache    H/O    There are no active problems to display for this patient.   Past Surgical History:  Procedure Laterality Date  . CHOLECYSTECTOMY N/A 10/16/2015   Procedure: LAPAROSCOPIC CHOLECYSTECTOMY ;  Surgeon: Nadeen Landau, MD;  Location: [facility] ORS;  Service: General;  Laterality: N/A;  . NO PAST SURGERIES       OB History   No obstetric history on file.      Home Medications    Prior to Admission medications   Medication Sig Start Date End Date Taking? Authorizing Provider  baclofen (LIORESAL) 10 MG tablet Take 1 tablet (10 mg total) by mouth 3 (three) times daily. 08/04/18   Jannifer Rodney A, FNP  benzonatate (TESSALON PERLES) 100 MG  capsule Take 1 capsule (100 mg total) by mouth 3 (three) times daily as needed. 06/16/18   Junie Spencer, FNP  buPROPion (WELLBUTRIN) 100 MG tablet Take 100 mg by mouth every morning.    [provider]  cholecalciferol (VITAMIN D) 1000 units tablet Take 1,000 Units by mouth daily.    [provider]  HYDROcodone-acetaminophen (NORCO) 5-325 MG tablet Take 1-2 tablets by mouth every 4 (four) hours as needed for moderate pain. 10/16/15   Nadeen Landau, MD  ibuprofen (ADVIL,MOTRIN) 800 MG tablet Take 1 tablet (800 mg total) by mouth every 8 (eight) hours as needed. 04/21/15   Ignacia Bayley, PA-C  naproxen (NAPROSYN) 500 MG tablet Take 1 tablet (500 mg total) by mouth 2 (two) times daily with a meal. 08/04/18   Hawks, Edilia Bo, FNP  ondansetron (ZOFRAN ODT) 4 MG disintegrating tablet Allow 1-2 tablets to dissolve in your mouth every 8 hours as needed for nausea/vomiting 09/01/16   Loleta Rose, MD  promethazine (PHENERGAN) 12.5 MG tablet Take 1 tablet (12.5 mg total) by mouth every 6 (six) hours as needed for nausea or vomiting. 11/15/15   Willy Eddy, MD    Family History History reviewed. No pertinent family history.  Social History Social History   Tobacco Use  .  Smoking status: Former Games developermoker  . Smokeless tobacco: Never Used  Substance Use Topics  . Alcohol use: No  . Drug use: No     Allergies   Patient has no known allergies.   Review of Systems Review of Systems  Musculoskeletal: Positive for back pain.  All other systems reviewed and are negative.    Physical Exam Updated Vital Signs BP 126/70   Pulse 84   Temp 98.2 F (36.8 C) (Oral)   Resp 16   Ht 5\' 6"  (1.676 m)   Wt 104.3 kg   SpO2 100%   BMI 37.12 kg/m   Physical Exam Vitals signs and nursing note reviewed.  Constitutional:      Appearance: She is well-developed.  HENT:     Head: Normocephalic and atraumatic.  Eyes:     Conjunctiva/sclera: Conjunctivae normal.     Pupils:  Pupils are equal, round, and reactive to light.  Neck:     Musculoskeletal: Normal range of motion.  Cardiovascular:     Rate and Rhythm: Normal rate and regular rhythm.     Heart sounds: Normal heart sounds.  Pulmonary:     Effort: Pulmonary effort is normal.     Breath sounds: Normal breath sounds.  Abdominal:     General: Bowel sounds are normal.     Palpations: Abdomen is soft.  Musculoskeletal: Normal range of motion.     Comments: Tenderness of right lumbar paraspinal region including SI joint; no bony deformities or step off noted; + SLR on right, negative on left; normal strength and sensation of both legs, normal gait  Skin:    General: Skin is warm and dry.  Neurological:     Mental Status: She is alert and oriented to person, place, and time.      ED Treatments / Results  Labs (all labs ordered are listed, but only abnormal results are displayed) Labs Reviewed  I-STAT BETA HCG BLOOD, ED ([facility], [facility], [facility] ONLY) - Abnormal; Notable for the following components:      Result Value   I-stat hCG, quantitative >2,000.0 (*)    All other components within normal limits    EKG None  Radiology No results found.  Procedures Procedures (including critical care time)  Medications Ordered in ED Medications  lidocaine (LIDODERM) 5 % 1 patch (1 patch Transdermal Patch Applied 08/09/18 2234)  acetaminophen (TYLENOL) tablet 1,000 mg (1,000 mg Oral Given 08/09/18 2235)     Initial Impression / Assessment and Plan / ED Course  I have reviewed the triage vital signs and the nursing notes.  Pertinent labs & imaging results that were available during my care of the patient were reviewed by me and considered in my medical decision making (see chart for details).  28 year old female here with right lower back pain for the past several days after moving to a new house last week.  She reports history of sciatica and states this feels similar.  Reports pain in the right lower back with  radiation into right buttock and right posterior thigh.  Denies any numbness or weakness of the legs.  No bowel or bladder incontinence.  No focal neurologic deficits to suggest cauda equina.  This does appear consistent with sciatica.  Patient also reports 2 positive home pregnancy tests.  Blood work here confirms.  I have advised her against taking anti-inflammatories as well as muscle relaxers given these findings.  She will be prescribed Lidoderm patches and instructed to take Tylenol.  Can also use  heat, ice, stretching, other supportive measures.  She will need to schedule follow-up with OB/GYN as soon as possible.  Can start taking prenatal vitamins.  She will return here for any new or acute changes.  Final Clinical Impressions(s) / ED Diagnoses   Final diagnoses:  Acute right-sided low back pain with sciatica, sciatica laterality unspecified    ED Discharge Orders         Ordered    lidocaine (LIDODERM) 5 %  Every 24 hours     08/09/18 2319           Garlon Hatchet, PA-C 08/09/18 2333    Mesner, Barbara Cower, MD 08/11/18 8295

## 2018-10-18 ENCOUNTER — Other Ambulatory Visit: Payer: Self-pay

## 2018-10-18 ENCOUNTER — Encounter: Payer: Self-pay | Admitting: Obstetrics & Gynecology

## 2018-10-18 ENCOUNTER — Ambulatory Visit (INDEPENDENT_AMBULATORY_CARE_PROVIDER_SITE_OTHER): Payer: No Typology Code available for payment source | Admitting: Obstetrics & Gynecology

## 2018-10-18 VITALS — BP 119/77 | HR 91 | Wt 240.0 lb

## 2018-10-18 DIAGNOSIS — Z3689 Encounter for other specified antenatal screening: Secondary | ICD-10-CM | POA: Diagnosis not present

## 2018-10-18 DIAGNOSIS — Z3490 Encounter for supervision of normal pregnancy, unspecified, unspecified trimester: Secondary | ICD-10-CM | POA: Insufficient documentation

## 2018-10-18 DIAGNOSIS — Z113 Encounter for screening for infections with a predominantly sexual mode of transmission: Secondary | ICD-10-CM

## 2018-10-18 DIAGNOSIS — Z3A15 15 weeks gestation of pregnancy: Secondary | ICD-10-CM | POA: Diagnosis not present

## 2018-10-18 DIAGNOSIS — Z124 Encounter for screening for malignant neoplasm of cervix: Secondary | ICD-10-CM

## 2018-10-18 DIAGNOSIS — O0990 Supervision of high risk pregnancy, unspecified, unspecified trimester: Secondary | ICD-10-CM | POA: Insufficient documentation

## 2018-10-18 DIAGNOSIS — Z3482 Encounter for supervision of other normal pregnancy, second trimester: Secondary | ICD-10-CM

## 2018-10-18 MED ORDER — ASPIRIN EC 81 MG PO TBEC: 81.0000 mg | DELAYED_RELEASE_TABLET | Freq: Every day | ORAL | 2 refills | Status: DC

## 2018-10-18 NOTE — Progress Notes (Signed)
History:   Sharon Solis is a 28 y.o. G3P2002 at [redacted]w[redacted]d by LMP being seen today for her first obstetrical visit.  Her obstetrical history is significant for two term SVDs. Patient does intend to breast feed. Pregnancy history fully reviewed.  Patient reports no complaints.     HISTORY: OB History  Gravida Para Term Preterm AB Living  3 2 2  0 0 2  SAB TAB Ectopic Multiple Live Births  0 0 0 0 2    # Outcome Date GA Lbr Len/2nd Weight Sex Delivery Anes PTL Lv  3 Current           2 Term 12/06/12    F Vag-Spont   LIV  1 Term 12/04/10    M Vag-Spont   LIV    Last pap smear was done many years ago and was normal  Past Medical History:  Diagnosis Date  . Anemia    H/O  . Depression   . Family history of adverse reaction to anesthesia    PTS MOM IS HARD TO WAKE UP  . Headache    H/O   Past Surgical History:  Procedure Laterality Date  . CHOLECYSTECTOMY N/A 10/16/2015   Procedure: LAPAROSCOPIC CHOLECYSTECTOMY ;  Surgeon: Leonie Green, MD;  Location: [facility] ORS;  Service: General;  Laterality: N/A;  . NO PAST SURGERIES     History reviewed. No pertinent family history. Social History   Tobacco Use  . Smoking status: Former Research scientist (life sciences)  . Smokeless tobacco: Never Used  Substance Use Topics  . Alcohol use: No  . Drug use: No   No Known Allergies Current Outpatient Medications on File Prior to Visit  Medication Sig Dispense Refill  . prenatal vitamin w/FE, FA (PRENATAL 1 + 1) 27-1 MG TABS tablet Take 1 tablet by mouth daily at 12 noon.     No current facility-administered medications on file prior to visit.     Review of Systems Pertinent items noted in HPI and remainder of comprehensive ROS otherwise negative. Physical Exam:   Vitals:   10/18/18 1556  BP: 119/77  Pulse: 91  Weight: 240 lb (108.9 kg)   Fetal Heart Rate (bpm): 152 Uterus:  Fundal Height: 15 cm  Pelvic Exam: Perineum: no hemorrhoids, normal perineum   Vulva: normal external genitalia, no  lesions   Vagina:  normal mucosa, normal discharge   Cervix: no lesions and normal, pap smear done.    Adnexa: normal adnexa and no mass, fullness, tenderness   Bony Pelvis: average  System: General: well-developed, well-nourished female in no acute distress   Breasts:  normal appearance, no masses or tenderness bilaterally   Skin: normal coloration and turgor, no rashes   Neurologic: oriented, normal, negative, normal mood   Extremities: normal strength, tone, and muscle mass, ROM of all joints is normal   HEENT PERRLA, extraocular movement intact and sclera clear, anicteric   Mouth/Teeth mucous membranes moist, pharynx normal without lesions and dental hygiene good   Neck supple and no masses   Cardiovascular: regular rate and rhythm   Respiratory:  no respiratory distress, normal breath sounds   Abdomen: soft, non-tender; bowel sounds normal; no masses,  no organomegaly     Assessment:    Pregnancy: [id number] Patient Active Problem List   Diagnosis Date Noted  . Supervision of normal pregnancy 10/18/2018     Plan:    1. Encounter for fetal anatomic survey - [place] MFM OB COMP + 14 WK; Future  2. Encounter  for supervision of other normal pregnancy in second trimester - Cytology - PAP - Obstetric Panel, Including HIV - Culture, OB Urine - Genetic Screening - Enroll Patient in Babyscripts - Babyscripts Schedule Optimization - TSH - Hemoglobin A1c - Comprehensive metabolic panel - AFP, Serum, Open Spina Bifida - prenatal vitamins - aspirin EC 81 MG tablet; Take 1 tablet (81 mg total) by mouth daily. Take after 12 weeks for prevention of preeclampsia later in pregnancy  Dispense: 300 tablet; Refill: 2 Initial labs drawn. Continue prenatal vitamins. Genetic Screening discussed, NIPS, Horizon and AFP: ordered. Ultrasound discussed; fetal anatomic survey: ordered. Problem list reviewed and updated. The nature of [facility] with multiple MDs  and other Advanced Practice Providers was explained to patient; also emphasized that residents, students are part of our team. Routine obstetric precautions reviewed. Return in about 4 weeks (around 11/15/2018) for Virtual LOB Visit (book OB Visit after ultrasound appointment, can be 5 weeks from now).     Jaynie CollinsUGONNA  Cristiano Capri, MD, FACOG Obstetrician & Gynecologist, [facility], [facility]

## 2018-10-18 NOTE — Patient Instructions (Signed)

## 2018-10-20 LAB — COMPREHENSIVE METABOLIC PANEL
ALT: 10 IU/L (ref 0–32)
AST: 7 IU/L (ref 0–40)
Albumin/Globulin Ratio: 1.6 (ref 1.2–2.2)
Albumin: 4.4 g/dL (ref 3.9–5.0)
Alkaline Phosphatase: 79 IU/L (ref 39–117)
BUN/Creatinine Ratio: 10 (ref 9–23)
BUN: 7 mg/dL (ref 6–20)
Bilirubin Total: <0.2 mg/dL (ref 0.0–1.2)
CO2: 18 mmol/L — ABNORMAL LOW (ref 20–29)
Calcium: 9.6 mg/dL (ref 8.7–10.2)
Chloride: 101 mmol/L (ref 96–106)
Creatinine, Ser: 0.67 mg/dL (ref 0.57–1.00)
GFR calc Af Amer: 140 mL/min/{1.73_m2} (ref >59)
GFR calc non Af Amer: 122 mL/min/{1.73_m2} (ref >59)
Globulin, Total: 2.7 g/dL (ref 1.5–4.5)
Glucose: 102 mg/dL — ABNORMAL HIGH (ref 65–99)
Potassium: 4 mmol/L (ref 3.5–5.2)
Sodium: 138 mmol/L (ref 134–144)
Total Protein: 7.1 g/dL (ref 6.0–8.5)

## 2018-10-20 LAB — OBSTETRIC PANEL, INCLUDING HIV
Antibody Screen: NEGATIVE
Basophils Absolute: 0.1 10*3/uL (ref 0.0–0.2)
Basos: 1 %
EOS (ABSOLUTE): 0.1 10*3/uL (ref 0.0–0.4)
Eos: 0 %
HIV Screen 4th Generation wRfx: NONREACTIVE
Hematocrit: 35.7 % (ref 34.0–46.6)
Hemoglobin: 12.1 g/dL (ref 11.1–15.9)
Hepatitis B Surface Ag: NEGATIVE
Immature Grans (Abs): 0 10*3/uL (ref 0.0–0.1)
Immature Granulocytes: 0 %
Lymphocytes Absolute: 1.8 10*3/uL (ref 0.7–3.1)
Lymphs: 16 %
MCH: 30.9 pg (ref 26.6–33.0)
MCHC: 33.9 g/dL (ref 31.5–35.7)
MCV: 91 fL (ref 79–97)
Monocytes Absolute: 0.7 10*3/uL (ref 0.1–0.9)
Monocytes: 6 %
Neutrophils Absolute: 9.2 10*3/uL — ABNORMAL HIGH (ref 1.4–7.0)
Neutrophils: 77 %
Platelets: 249 10*3/uL (ref 150–450)
RBC: 3.92 x10E6/uL (ref 3.77–5.28)
RDW: 12.9 % (ref 11.7–15.4)
RPR Ser Ql: NONREACTIVE
Rh Factor: POSITIVE
Rubella Antibodies, IGG: 2.61 index (ref >0.99)
WBC: 11.8 10*3/uL — ABNORMAL HIGH (ref 3.4–10.8)

## 2018-10-20 LAB — AFP, SERUM, OPEN SPINA BIFIDA
AFP MoM: 1.19
AFP Value: 27.9 ng/mL
Gest. Age on Collection Date: 15.7 weeks
Maternal Age At EDD: 27.3 yr
OSBR Risk 1 IN: 6704
Test Results:: NEGATIVE
Weight: 240 [lb_av]

## 2018-10-20 LAB — TSH: TSH: 2.35 u[IU]/mL (ref 0.450–4.500)

## 2018-10-20 LAB — HEMOGLOBIN A1C
Est. average glucose Bld gHb Est-mCnc: 103 mg/dL
Hgb A1c MFr Bld: 5.2 % (ref 4.8–5.6)

## 2018-10-21 ENCOUNTER — Encounter: Payer: Self-pay | Admitting: Obstetrics & Gynecology

## 2018-10-21 DIAGNOSIS — R87612 Low grade squamous intraepithelial lesion on cytologic smear of cervix (LGSIL): Secondary | ICD-10-CM | POA: Insufficient documentation

## 2018-10-21 LAB — CYTOLOGY - PAP
Chlamydia: NEGATIVE
Neisseria Gonorrhea: NEGATIVE

## 2018-10-22 ENCOUNTER — Telehealth: Payer: Self-pay | Admitting: *Deleted

## 2018-10-22 NOTE — Telephone Encounter (Signed)
Left message to call back regarding results.

## 2018-10-22 NOTE — Telephone Encounter (Signed)
Pt informed of pap results and needing a colpo, will change visit form virtual to in office. Pt verbalizes and understands.

## 2018-10-23 LAB — URINE CULTURE, OB REFLEX

## 2018-10-23 LAB — CULTURE, OB URINE

## 2018-10-24 ENCOUNTER — Encounter: Payer: Self-pay | Admitting: Obstetrics & Gynecology

## 2018-10-24 DIAGNOSIS — O99820 Streptococcus B carrier state complicating pregnancy: Secondary | ICD-10-CM | POA: Insufficient documentation

## 2018-10-25 ENCOUNTER — Telehealth: Payer: Self-pay | Admitting: Radiology

## 2018-10-25 NOTE — Telephone Encounter (Signed)
Left message for patient to call [facility] to give Panorama results

## 2018-10-26 ENCOUNTER — Encounter: Payer: Self-pay | Admitting: Radiology

## 2018-10-27 ENCOUNTER — Telehealth: Payer: Self-pay | Admitting: *Deleted

## 2018-10-27 NOTE — Telephone Encounter (Signed)
Called patient to follow up on a message from after hour RN line. Babyscripts called to report elevated BP and HA. Pt states she did have a a bad HA yesterday afternoon, but once she sat down and relaxed her BP came down, and has no HA or vision changes today and states she fells fine. Informed pt continue to monitor and she can take Tylenol if needed for HA and if BP gets high, any vision changes, swelling or HA does not get better with Tylenol  to call office. Pt verbalizes and understands.

## 2018-11-08 ENCOUNTER — Encounter: Payer: Self-pay | Admitting: *Deleted

## 2018-11-09 ENCOUNTER — Encounter: Payer: Self-pay | Admitting: *Deleted

## 2018-11-09 ENCOUNTER — Telehealth: Payer: Self-pay | Admitting: *Deleted

## 2018-11-09 ENCOUNTER — Encounter: Payer: Self-pay | Admitting: Obstetrics & Gynecology

## 2018-11-09 DIAGNOSIS — O289 Unspecified abnormal findings on antenatal screening of mother: Secondary | ICD-10-CM | POA: Insufficient documentation

## 2018-11-09 NOTE — Telephone Encounter (Signed)
Left message for pt to call back in regards to her Horizon results.

## 2018-11-09 NOTE — Telephone Encounter (Signed)
Pt called and was informed of Horizon Carrier Screen results.

## 2018-11-16 ENCOUNTER — Telehealth: Payer: Self-pay | Admitting: Radiology

## 2018-11-16 ENCOUNTER — Encounter: Payer: Self-pay | Admitting: Obstetrics & Gynecology

## 2018-11-16 ENCOUNTER — Ambulatory Visit (INDEPENDENT_AMBULATORY_CARE_PROVIDER_SITE_OTHER): Payer: No Typology Code available for payment source | Admitting: Obstetrics & Gynecology

## 2018-11-16 ENCOUNTER — Other Ambulatory Visit: Payer: Self-pay

## 2018-11-16 VITALS — BP 105/78 | HR 78 | Wt 243.6 lb

## 2018-11-16 DIAGNOSIS — Z3A19 19 weeks gestation of pregnancy: Secondary | ICD-10-CM

## 2018-11-16 DIAGNOSIS — R87612 Low grade squamous intraepithelial lesion on cytologic smear of cervix (LGSIL): Secondary | ICD-10-CM

## 2018-11-16 DIAGNOSIS — Z3482 Encounter for supervision of other normal pregnancy, second trimester: Secondary | ICD-10-CM

## 2018-11-16 NOTE — Telephone Encounter (Signed)
Left message for patient to call [facility] to schedule 4 WK virtual and 8 week office visit for 73 WK ROB/ GTT

## 2018-11-16 NOTE — Progress Notes (Signed)
   PRENATAL VISIT NOTE  Subjective:  Sharon Solis is a 28 y.o. G3P2002 at [redacted]w[redacted]d being seen today for ongoing prenatal care.  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  Patient reports no complaints.  Contractions: Not present. Vag. Bleeding: None.  Movement: Present. Denies leaking of fluid.   The following portions of the patient's history were reviewed and updated as appropriate: allergies, current medications, past family history, past medical history, past social history, past surgical history and problem list.   Objective:   Vitals:   11/16/18 0924  BP: 105/78  Pulse: 78  Weight: 243 lb 9.6 oz (110.5 kg)    Fetal Status: Fetal Heart Rate (bpm): 145 Fundal Height: 20 cm Movement: Present     General:  Alert, oriented and cooperative. Patient is in no acute distress.  Skin: Skin is warm and dry. No rash noted.   Cardiovascular: Normal heart rate noted  Respiratory: Normal respiratory effort, no problems with respiration noted  Abdomen: Soft, gravid, appropriate for gestational age.  Pain/Pressure: Absent     Pelvic: Cervical exam performed. Multipatous cervix seen, no abnormal discharge. Colposcopy done, see note below.  Extremities: Normal range of motion.  Edema: None  Mental Status: Normal mood and affect. Normal behavior. Normal judgment and thought content.    COLPOSCOPY PROCEDURE NOTE 28 y.o. [id number] at [redacted]w[redacted]d here for colposcopy for low-grade squamous intraepithelial neoplasia (LGSIL - encompassing HPV,mild dysplasia,CIN I) pap smear on 10/18/2018. Discussed role for HPV in cervical dysplasia, need for surveillance. Patient given informed consent, signed copy in the chart, time out was performed.  Placed in lithotomy  position. Cervix viewed with speculum and colposcope after application of acetic acid.  Colposcopy adequate? Yes  Patient had some mild acetowhite changes noted on anterior and posterior lips, no abnormal vasculature, no mosaicism. Clinical diagnosis: CIN I. No biopsy obtained. Will follow up with postpartum pap smear.   Assessment and Plan:  Pregnancy: G3P2002 at [redacted]w[redacted]d 1. 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  2. Encounter for supervision of other normal pregnancy in second trimester Low risk NIPS, negative AFP. Scheduled for anatomy scan next week.  Preterm labor symptoms and general obstetric precautions including but not limited to vaginal bleeding, contractions, leaking of fluid and fetal movement were reviewed in detail with the patient. Please refer to After Visit Summary for other counseling recommendations.   Return in about 4 weeks (around 12/14/2018) for Virtual OB Visit.  Future Appointments  Date Time Provider [place]  11/25/2018 10:00 AM [place] [facility]  11/25/2018 10:00 AM [facility] [place] 3 [facility]    Verita Schneiders, MD

## 2018-11-16 NOTE — Patient Instructions (Signed)
Return to office for any scheduled appointments. Call the office or go to the MAU at [place] at [facility] if:  You begin to have strong, frequent contractions  Your water breaks.  Sometimes it is a big gush of fluid, sometimes it is just a trickle that keeps getting your panties wet or running down your legs  You have vaginal bleeding.  It is normal to have a small amount of spotting if your cervix was checked.   You do not feel your baby moving like normal.  If you do not, get something to eat and drink and lay down and focus on feeling your baby move.   If your baby is still not moving like normal, you should call the office or go to MAU.  Any other obstetric concerns.   COLPOSCOPY POST-PROCEDURE INSTRUCTIONS  1. You may take Tylenol for cramping if needed.  2. If Monsel's solution was used, you will have a black discharge.  3. Light bleeding is normal.  If bleeding is heavier than your period, please call.  4. Put nothing in your vagina until the bleeding or discharge stops (usually 2 or3 days).  5. We will call you within one week with biopsy results (if biopsy was done) or discuss the results at your follow-up appointment if needed.

## 2018-11-25 ENCOUNTER — Ambulatory Visit (HOSPITAL_COMMUNITY): Payer: Medicaid Other

## 2018-11-25 ENCOUNTER — Encounter (HOSPITAL_COMMUNITY): Payer: Self-pay

## 2018-11-25 ENCOUNTER — Other Ambulatory Visit: Payer: Self-pay | Admitting: Obstetrics & Gynecology

## 2018-11-25 ENCOUNTER — Other Ambulatory Visit: Payer: Self-pay

## 2018-11-25 ENCOUNTER — Other Ambulatory Visit (HOSPITAL_COMMUNITY): Payer: Self-pay | Admitting: *Deleted

## 2018-11-25 ENCOUNTER — Ambulatory Visit (HOSPITAL_COMMUNITY)
Admission: RE | Admit: 2018-11-25 | Discharge: 2018-11-25 | Disposition: A | Discharge status: Home / Self Care | Payer: Medicaid Other | Source: Ambulatory Visit | Attending: Obstetrics and Gynecology | Admitting: Obstetrics and Gynecology

## 2018-11-25 DIAGNOSIS — O99212 Obesity complicating pregnancy, second trimester: Secondary | ICD-10-CM

## 2018-11-25 DIAGNOSIS — Z3689 Encounter for other specified antenatal screening: Secondary | ICD-10-CM | POA: Diagnosis not present

## 2018-11-25 DIAGNOSIS — Z3482 Encounter for supervision of other normal pregnancy, second trimester: Secondary | ICD-10-CM

## 2018-11-25 DIAGNOSIS — Z3A21 21 weeks gestation of pregnancy: Secondary | ICD-10-CM | POA: Diagnosis not present

## 2019-01-08 ENCOUNTER — Inpatient Hospital Stay (HOSPITAL_COMMUNITY)
Admission: AD | Admit: 2019-01-08 | Discharge: 2019-01-09 | Disposition: A | Discharge status: Home / Self Care | Payer: Medicaid Other | Attending: Obstetrics & Gynecology | Admitting: Obstetrics & Gynecology

## 2019-01-08 ENCOUNTER — Other Ambulatory Visit: Payer: Self-pay

## 2019-01-08 ENCOUNTER — Encounter (HOSPITAL_COMMUNITY): Payer: Self-pay

## 2019-01-08 DIAGNOSIS — Z87891 Personal history of nicotine dependence: Secondary | ICD-10-CM | POA: Insufficient documentation

## 2019-01-08 DIAGNOSIS — O26892 Other specified pregnancy related conditions, second trimester: Secondary | ICD-10-CM | POA: Insufficient documentation

## 2019-01-08 DIAGNOSIS — R109 Unspecified abdominal pain: Secondary | ICD-10-CM

## 2019-01-08 DIAGNOSIS — O26899 Other specified pregnancy related conditions, unspecified trimester: Secondary | ICD-10-CM

## 2019-01-08 DIAGNOSIS — R1030 Lower abdominal pain, unspecified: Secondary | ICD-10-CM | POA: Insufficient documentation

## 2019-01-08 DIAGNOSIS — Z7982 Long term (current) use of aspirin: Secondary | ICD-10-CM | POA: Insufficient documentation

## 2019-01-08 DIAGNOSIS — Z3A27 27 weeks gestation of pregnancy: Secondary | ICD-10-CM | POA: Insufficient documentation
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
## 2019-01-09 DIAGNOSIS — R1030 Lower abdominal pain, unspecified: Secondary | ICD-10-CM | POA: Diagnosis not present

## 2019-01-09 DIAGNOSIS — Z3A27 27 weeks gestation of pregnancy: Secondary | ICD-10-CM | POA: Diagnosis not present

## 2019-01-09 DIAGNOSIS — Z87891 Personal history of nicotine dependence: Secondary | ICD-10-CM | POA: Diagnosis not present

## 2019-01-09 DIAGNOSIS — O26892 Other specified pregnancy related conditions, second trimester: Secondary | ICD-10-CM

## 2019-01-09 DIAGNOSIS — Z7982 Long term (current) use of aspirin: Secondary | ICD-10-CM | POA: Diagnosis not present

## 2019-01-09 DIAGNOSIS — R109 Unspecified abdominal pain: Secondary | ICD-10-CM | POA: Diagnosis not present

## 2019-01-09 LAB — WET PREP, GENITAL
Clue Cells Wet Prep HPF POC: NONE SEEN
Sperm: NONE SEEN
Trich, Wet Prep: NONE SEEN
Yeast Wet Prep HPF POC: NONE SEEN

## 2019-01-09 LAB — URINALYSIS, ROUTINE W REFLEX MICROSCOPIC
Bacteria, UA: NONE SEEN
Bilirubin Urine: NEGATIVE
Glucose, UA: NEGATIVE mg/dL
Hgb urine dipstick: NEGATIVE
Ketones, ur: NEGATIVE mg/dL
Leukocytes,Ua: NEGATIVE
Nitrite: NEGATIVE
Protein, ur: NEGATIVE mg/dL
Specific Gravity, Urine: 1.021 (ref 1.005–1.030)
pH: 7 (ref 5.0–8.0)

## 2019-01-09 NOTE — Discharge Instructions (Signed)
Glucose Tolerance Test During Pregnancy Why am I having this test? The glucose tolerance test (GTT) is done to check how your body processes sugar (glucose). This is one of several tests used to diagnose diabetes that develops during pregnancy (gestational diabetes mellitus). Gestational diabetes is a temporary form of diabetes that some women develop during pregnancy. It usually occurs during the second trimester of pregnancy and goes away after delivery. Testing (screening) for gestational diabetes usually occurs between 24 and 28 weeks of pregnancy. You may have the GTT test after having a 1-hour glucose screening test if the results from that test indicate that you may have gestational diabetes. You may also have this test if:  You have a history of gestational diabetes.  You have a history of giving birth to very large babies or have experienced repeated fetal loss (stillbirth).  You have signs and symptoms of diabetes, such as: ? Changes in your vision. ? Tingling or numbness in your hands or feet. ? Changes in hunger, thirst, and urination that are not otherwise explained by your pregnancy. What is being tested? This test measures the amount of glucose in your blood at different times during a period of 3 hours. This indicates how well your body is able to process glucose. What kind of sample is taken?  Blood samples are required for this test. They are usually collected by inserting a needle into a blood vessel. How do I prepare for this test?  For 3 days before your test, eat normally. Have plenty of carbohydrate-rich foods.  Follow instructions from your health care provider about: ? Eating or drinking restrictions on the day of the test. You may be asked to not eat or drink anything other than water (fast) starting 8-10 hours before the test. ? Changing or stopping your regular medicines. Some medicines may interfere with this test. Tell a health care provider about:  All  medicines you are taking, including vitamins, herbs, eye drops, creams, and over-the-counter medicines.  Any blood disorders you have.  Any surgeries you have had.  Any medical conditions you have. What happens during the test? First, your blood glucose will be measured. This is referred to as your fasting blood glucose, since you fasted before the test. Then, you will drink a glucose solution that contains a certain amount of glucose. Your blood glucose will be measured again 1, 2, and 3 hours after drinking the solution. This test takes about 3 hours to complete. You will need to stay at the testing location during this time. During the testing period:  Do not eat or drink anything other than the glucose solution.  Do not exercise.  Do not use any products that contain nicotine or tobacco, such as cigarettes and e-cigarettes. If you need help stopping, ask your health care provider. The testing procedure may vary among health care providers and hospitals. How are the results reported? Your results will be reported as milligrams of glucose per deciliter of blood (mg/dL) or millimoles per liter (mmol/L). Your health care provider will compare your results to normal ranges that were established after testing a large group of people (reference ranges). Reference ranges may vary among labs and hospitals. For this test, common reference ranges are:  Fasting: less than 95-105 mg/dL (5.3-5.8 mmol/L).  1 hour after drinking glucose: less than 180-190 mg/dL (10.0-10.5 mmol/L).  2 hours after drinking glucose: less than 155-165 mg/dL (8.6-9.2 mmol/L).  3 hours after drinking glucose: 140-145 mg/dL (7.8-8.1 mmol/L). What do the   results mean? Results within reference ranges are considered normal, meaning that your glucose levels are well-controlled. If two or more of your blood glucose levels are high, you may be diagnosed with gestational diabetes. If only one level is high, your health care  provider may suggest repeat testing or other tests to confirm a diagnosis. Talk with your health care provider about what your results mean. Questions to ask your health care provider Ask your health care provider, or the department that is doing the test:  When will my results be ready?  How will I get my results?  What are my treatment options?  What other tests do I need?  What are my next steps? Summary  The glucose tolerance test (GTT) is one of several tests used to diagnose diabetes that develops during pregnancy (gestational diabetes mellitus). Gestational diabetes is a temporary form of diabetes that some women develop during pregnancy.  You may have the GTT test after having a 1-hour glucose screening test if the results from that test indicate that you may have gestational diabetes. You may also have this test if you have any symptoms or risk factors for gestational diabetes.  Talk with your health care provider about what your results mean. This information is not intended to replace advice given to you by your health care provider. Make sure you discuss any questions you have with your health care provider. Document Released: 09/16/2011 Document Revised: 07/08/2018 Document Reviewed: 10/27/2016 Elsevier Patient Education  2020 [employer]  

## 2019-01-09 NOTE — MAU Provider Note (Signed)
Patient Sharon Solis is a 28 y.o. G3P2002 at [redacted]w[redacted]d here with complaints of abdominal pain that started this evening around 9 pm. She denies vaginal bleeding, LOF, decreased fetal movements, vaginal discharge, dysuria.   Last intercourse was this morning. She denies any complications in this pregnancy.   History     CSN: [id number]  Arrival date and time: 01/08/19 2230   First Provider Initiated Contact with Patient 01/09/19 0010      No chief complaint on file.  Abdominal Pain This is a new problem. The current episode started today. The pain is located in the suprapubic region. The pain is at a severity of 10/10. The quality of the pain is cramping. The abdominal pain does not radiate. Pertinent negatives include no constipation, diarrhea, dysuria, nausea or vomiting.    OB History    Gravida  3   Para  2   Term  2   Preterm      AB      Living  2     SAB      TAB      Ectopic      Multiple      Live Births  2           Past Medical History:  Diagnosis Date  . Anemia    H/O  . Depression   . Family history of adverse reaction to anesthesia    PTS MOM IS HARD TO WAKE UP  . Headache    H/O    Past Surgical History:  Procedure Laterality Date  . CHOLECYSTECTOMY N/A 10/16/2015   Procedure: LAPAROSCOPIC CHOLECYSTECTOMY ;  Surgeon: Leonie Green, MD;  Location: [facility] ORS;  Service: General;  Laterality: N/A;    Family History  Problem Relation Age of Onset  . Hypothyroidism Mother   . Cancer Father     Social History   Tobacco Use  . Smoking status: Former Research scientist (life sciences)  . Smokeless tobacco: Never Used  Substance Use Topics  . Alcohol use: No  . Drug use: No    Allergies: No Known Allergies  Medications Prior to Admission  Medication Sig Dispense Refill Last Dose  . aspirin EC 81 MG tablet Take 1 tablet (81 mg total) by mouth daily. Take after 12 weeks for prevention of preeclampsia later in pregnancy 300 tablet 2 01/08/2019 at 1500   . prenatal vitamin w/FE, FA (PRENATAL 1 + 1) 27-1 MG TABS tablet Take 1 tablet by mouth daily at 12 noon.   01/08/2019 at 1500    Review of Systems  Gastrointestinal: Positive for abdominal pain. Negative for constipation, diarrhea, nausea and vomiting.  Genitourinary: Negative for dysuria.   Physical Exam   Blood pressure 122/86, pulse 98, temperature 98.4 F (36.9 C), temperature source Oral, resp. rate 20, height 5\' 6"  (1.676 m), weight 112.2 kg, last menstrual period 06/30/2018, SpO2 99 %.  Physical Exam  Constitutional: She appears well-developed.  HENT:  Head: Normocephalic.  Eyes: Pupils are equal, round, and reactive to light.  Neck: Normal range of motion.  Respiratory: Effort normal.  GI: Soft.  Genitourinary:    Vagina normal.     Genitourinary Comments: Cervix is closed; long, posterior.    Musculoskeletal: Normal range of motion.  Neurological: She is alert.  Skin: Skin is warm.    MAU Course  Procedures  MDM -UA is negative for signs of infection  -wet prep negative  -FFN not done as patient had recent intercourse this morning.  Cervix is unchanged over 2 hours of monitoring in MAU.   -NST: 140 bpm, mod var, present acel, neg decels, no contractions. Repetitive variables at 2348 to 0008 but resolved with repositioning and did not recur.   Assessment and Plan   1. Abdominal cramping affecting pregnancy    2. Patient stable for discharge; reviewed warning signs and when to return to MAU. Patient to return if her condition changes or worsens.   3. Message sent to clinic to schedule patient for in-person OB and 2 hour gtt.    Sharon Solis  01/09/2019, 1:45 AM

## 2019-01-09 NOTE — Progress Notes (Signed)
This note also relates to the following rows which could not be included: SpO2 - Cannot attach notes to unvalidated device data  Monitors readjusted

## 2019-01-10 ENCOUNTER — Telehealth: Payer: Self-pay | Admitting: Radiology

## 2019-01-10 NOTE — Telephone Encounter (Signed)
Left message for patient to call [facility] to schedule 28 Wk ROB/ GTT

## 2019-01-13 LAB — GC/CHLAMYDIA PROBE AMP (~~LOC~~) NOT AT ARMC
Chlamydia: NEGATIVE
Comment: NEGATIVE
Comment: NORMAL
Neisseria Gonorrhea: NEGATIVE

## 2019-01-26 ENCOUNTER — Ambulatory Visit (INDEPENDENT_AMBULATORY_CARE_PROVIDER_SITE_OTHER): Payer: Commercial Managed Care - PPO | Admitting: Advanced Practice Midwife

## 2019-01-26 ENCOUNTER — Encounter: Payer: Self-pay | Admitting: Advanced Practice Midwife

## 2019-01-26 ENCOUNTER — Other Ambulatory Visit: Payer: Self-pay

## 2019-01-26 VITALS — BP 112/78 | HR 93 | Wt 248.0 lb

## 2019-01-26 DIAGNOSIS — Z23 Encounter for immunization: Secondary | ICD-10-CM

## 2019-01-26 DIAGNOSIS — O99210 Obesity complicating pregnancy, unspecified trimester: Secondary | ICD-10-CM

## 2019-01-26 DIAGNOSIS — Z3483 Encounter for supervision of other normal pregnancy, third trimester: Secondary | ICD-10-CM

## 2019-01-26 DIAGNOSIS — O99213 Obesity complicating pregnancy, third trimester: Secondary | ICD-10-CM

## 2019-01-26 DIAGNOSIS — Z3009 Encounter for other general counseling and advice on contraception: Secondary | ICD-10-CM

## 2019-01-26 DIAGNOSIS — Z3A30 30 weeks gestation of pregnancy: Secondary | ICD-10-CM

## 2019-01-26 NOTE — Progress Notes (Signed)
   PRENATAL VISIT NOTE  Subjective:  Sharon Solis is a 28 y.o. G3P2002 at [redacted]w[redacted]d being seen today for ongoing prenatal care.  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  Patient reports no complaints.  Contractions: Not present.  .  Movement: Present. Denies leaking of fluid.   Patient is s/p MAU visit on 01/09/19 for concern for preterm contractions. She denies any episodes of abdominal pain since that time.  The following portions of the patient's history were reviewed and updated as appropriate: allergies, current medications, past family history, past medical history, past social history, past surgical history and problem list. Problem list updated.  Objective:   Vitals:   01/26/19 0854  BP: 112/78  Pulse: 93  Weight: 248 lb (112.5 kg)    Fetal Status: Fetal Heart Rate (bpm): 145 Fundal Height: 31 cm Movement: Present     General:  Alert, oriented and cooperative. Patient is in no acute distress.  Skin: Skin is warm and dry. No rash noted.   Cardiovascular: Normal heart rate noted  Respiratory: Normal respiratory effort, no problems with respiration noted  Abdomen: Soft, gravid, appropriate for gestational age.  Pain/Pressure: Absent     Pelvic: Cervical exam deferred        Extremities: Normal range of motion.  Edema: None  Mental Status: Normal mood and affect. Normal behavior. Normal judgment and thought content.   Assessment and Plan:  Pregnancy: G3P2002 at [redacted]w[redacted]d  1. Encounter for supervision of other normal pregnancy in third trimester - Continue routine care - Proven pelvis 7lb 11 oz - HIV Antibody (routine testing w rflx) - RPR - Glucose Tolerance, 2 Hours w/1 Hour - CBC  2. Sterilization consult - Desires BTL.  Consent signed today - For MD next visit to address any questions specific to procedure itself  3. Obesity in pregnancy - TWG 18lb based on reported pregravid weight of 230 lb  Preterm labor symptoms and general obstetric precautions including but not limited to vaginal bleeding, contractions, leaking of fluid and fetal movement were reviewed in detail with the patient. Please refer to After Visit Summary for other counseling recommendations.  Return in about 3 weeks (around 02/16/2019) for Virtual visit with MD.  Future Appointments  Date Time Provider [place]  02/10/2019  1:45 PM [facility] [place] 2 [facility]  02/16/2019  4:00 PM Caren Macadam, MD [place], CNM

## 2019-01-26 NOTE — Patient Instructions (Addendum)
Third Trimester of Pregnancy  The third trimester is from week 28 through week 40 (months 7 through 9). This trimester is when your unborn baby (fetus) is growing very fast. At the end of the ninth month, the unborn baby is about 20 inches in length. It weighs about 6-10 pounds. Follow these instructions at home: Medicines  Take over-the-counter and prescription medicines only as told by your doctor. Some medicines are safe and some medicines are not safe during pregnancy.  Take a prenatal vitamin that contains at least 600 micrograms (mcg) of folic acid.  If you have trouble pooping (constipation), take medicine that will make your stool soft (stool softener) if your doctor approves. Eating and drinking   Eat regular, healthy meals.  Avoid raw meat and uncooked cheese.  If you get low calcium from the food you eat, talk to your doctor about taking a daily calcium supplement.  Eat four or five small meals rather than three large meals a day.  Avoid foods that are high in fat and sugars, such as fried and sweet foods.  To prevent constipation: ? Eat foods that are high in fiber, like fresh fruits and vegetables, whole grains, and beans. ? Drink enough fluids to keep your pee (urine) clear or pale yellow. Activity  Exercise only as told by your doctor. Stop exercising if you start to have cramps.  Avoid heavy lifting, wear low heels, and sit up straight.  Do not exercise if it is too hot, too humid, or if you are in a place of great height (high altitude).  You may continue to have sex unless your doctor tells you not to. Relieving pain and discomfort  Wear a good support bra if your breasts are tender.  Take frequent breaks and rest with your legs raised if you have leg cramps or low back pain.  Take warm water baths (sitz baths) to soothe pain or discomfort caused by hemorrhoids. Use hemorrhoid cream if your doctor approves.  If you develop puffy, bulging veins (varicose  veins) in your legs: ? Wear support hose or compression stockings as told by your doctor. ? Raise (elevate) your feet for 15 minutes, 3-4 times a day. ? Limit salt in your food. Safety  Wear your seat belt when driving.  Make a list of emergency phone numbers, including numbers for family, friends, the hospital, and police and fire departments. Preparing for your baby's arrival To prepare for the arrival of your baby:  Take prenatal classes.  Practice driving to the hospital.  Visit the hospital and tour the maternity area.  Talk to your work about taking leave once the baby comes.  Pack your hospital bag.  Prepare the baby's room.  Go to your doctor visits.  Buy a rear-facing car seat. Learn how to install it in your car. General instructions  Do not use hot tubs, steam rooms, or saunas.  Do not use any products that contain nicotine or tobacco, such as cigarettes and e-cigarettes. If you need help quitting, ask your doctor.  Do not drink alcohol.  Do not douche or use tampons or scented sanitary pads.  Do not cross your legs for long periods of time.  Do not travel for long distances unless you must. Only do so if your doctor says it is okay.  Visit your dentist if you have not gone during your pregnancy. Use a soft toothbrush to brush your teeth. Be gentle when you floss.  Avoid cat litter boxes and soil   used by cats. These carry germs that can cause birth defects in the baby and can cause a loss of your baby (miscarriage) or stillbirth.  Keep all your prenatal visits as told by your doctor. This is important. Contact a doctor if:  You are not sure if you are in labor or if your water has broken.  You are dizzy.  You have mild cramps or pressure in your lower belly.  You have a nagging pain in your belly area.  You continue to feel sick to your stomach, you throw up, or you have watery poop.  You have bad smelling fluid coming from your vagina.  You have  pain when you pee. Get help right away if:  You have a fever.  You are leaking fluid from your vagina.  You are spotting or bleeding from your vagina.  You have severe belly cramps or pain.  You lose or gain weight quickly.  You have trouble catching your breath and have chest pain.  You notice sudden or extreme puffiness (swelling) of your face, hands, ankles, feet, or legs.  You have not felt the baby move in over an hour.  You have severe headaches that do not go away with medicine.  You have trouble seeing.  You are leaking, or you are having a gush of fluid, from your vagina before you are 37 weeks.  You have regular belly spasms (contractions) before you are 37 weeks. Summary  The third trimester is from week 28 through week 40 (months 7 through 9). This time is when your unborn baby is growing very fast.  Follow your doctor's advice about medicine, food, and activity.  Get ready for the arrival of your baby by taking prenatal classes, getting all the baby items ready, preparing the baby's room, and visiting your doctor to be checked.  Get help right away if you are bleeding from your vagina, or you have chest pain and trouble catching your breath, or if you have not felt your baby move in over an hour. This information is not intended to replace advice given to you by your health care provider. Make sure you discuss any questions you have with your health care provider. Document Released: 06/11/2009 Document Revised: 07/08/2018 Document Reviewed: 04/22/2016 Elsevier Patient Education  2020 [place].   Surgery to Prevent Pregnancy Female sterilization is surgery to prevent pregnancy. In this surgery, the fallopian tubes are either blocked or closed off. When the fallopian tubes are closed, the eggs that the ovaries release cannot enter the uterus, sperm cannot reach the eggs, and you cannot get pregnant. Sterilization is permanent. It should only be done if you  are sure that you do not want to be able to have children. What are the sterilization surgery options? There are several kinds of female sterilization surgeries. They include:  Laparoscopic tubal ligation. In this surgery, the fallopian tubes are tied off, sealed with heat, or blocked with a clip, ring, or clamp. A small portion of each fallopian tube may also be removed. This surgery is done through several small cuts (incisions) with special instruments that are inserted into your abdomen.  Postpartum tubal ligation. This is also called a mini-laparotomy. This surgery is done right after childbirth or 1 or 2 days after childbirth. In this surgery, the fallopian tubes are tied off, sealed with heat, or blocked with a clip, ring, or clamp. A small portion of each fallopian tube may also be removed. The surgery is done through  a single incision in the abdomen.  Tubal ligation during a C-section. In this surgery, the fallopian tubes are tied off, sealed with heat, or blocked with a clip, ring, or clamp. A small portion of each fallopian tube may also be removed. The surgery is done at the same time as a C-section delivery. Is sterilization safe? Generally, sterilization is safe. Complications are rare. However, there are risks. They include:  Bleeding.  Infection.  Reaction to medicine used during the procedure.  Injury to surrounding organs.  Failure of the procedure. How effective is sterilization? Sterilization is nearly 100% effective, but it can fail. In rare cases, the fallopian tubes can grow back together over time. If this happens, pregnancy may be possible and you will be able to get pregnant again. Women who have had this procedure have a higher chance of having an ectopic pregnancy. An ectopic pregnancy is a pregnancy that happens outside of the uterus. This kind of pregnancy can lead to serious bleeding if it is not treated. What are the benefits?  It is usually effective for a  lifetime.  It is usually safe.  It does not have the drawbacks of other types of birth control in that your hormones are not affected. Because of this, your menstrual periods, sexual desire, and sexual performance will not be affected. What are the drawbacks?  You will need to recover and may have complications after surgery.  If you change your mind and decide that you want to have children, you may not be able to. Sterilization may be reversed, but a reversal is not always successful.  It does not provide protection against STDs (sexually transmitted diseases).  It increases the chance of having an ectopic pregnancy. Follow these instructions at home:  Keep all follow-up visits as told by your health care provider. This is important. Summary  Female sterilization is surgery to prevent pregnancy.  There are different types of female sterilization surgeries.  Sterilization may be reversed, but a reversal is not always successful.  Sterilization does not protect against STDs. This information is not intended to replace advice given to you by your health care provider. Make sure you discuss any questions you have with your health care provider. Document Released: 09/03/2007 Document Revised: 11/26/2017 Document Reviewed: 11/27/2017 Elsevier Patient Education  2020 [employer].

## 2019-01-26 NOTE — Progress Notes (Signed)
Had tdap in march  Discuss tubal

## 2019-01-27 ENCOUNTER — Other Ambulatory Visit: Payer: Self-pay | Admitting: *Deleted

## 2019-01-27 ENCOUNTER — Encounter: Payer: Self-pay | Admitting: Advanced Practice Midwife

## 2019-01-27 ENCOUNTER — Telehealth: Payer: Self-pay | Admitting: *Deleted

## 2019-01-27 DIAGNOSIS — Z3483 Encounter for supervision of other normal pregnancy, third trimester: Secondary | ICD-10-CM

## 2019-01-27 DIAGNOSIS — O24410 Gestational diabetes mellitus in pregnancy, diet controlled: Secondary | ICD-10-CM | POA: Insufficient documentation

## 2019-01-27 DIAGNOSIS — O24415 Gestational diabetes mellitus in pregnancy, controlled by oral hypoglycemic drugs: Secondary | ICD-10-CM | POA: Insufficient documentation

## 2019-01-27 LAB — CBC
Hematocrit: 34.9 % (ref 34.0–46.6)
Hemoglobin: 11.8 g/dL (ref 11.1–15.9)
MCH: 29.7 pg (ref 26.6–33.0)
MCHC: 33.8 g/dL (ref 31.5–35.7)
MCV: 88 fL (ref 79–97)
Platelets: 214 10*3/uL (ref 150–450)
RBC: 3.97 x10E6/uL (ref 3.77–5.28)
RDW: 12.5 % (ref 11.7–15.4)
WBC: 9.4 10*3/uL (ref 3.4–10.8)

## 2019-01-27 LAB — GLUCOSE TOLERANCE, 2 HOURS W/ 1HR
Glucose, 1 hour: 181 mg/dL — ABNORMAL HIGH (ref 65–179)
Glucose, 2 hour: 117 mg/dL (ref 65–152)
Glucose, Fasting: 102 mg/dL — ABNORMAL HIGH (ref 65–91)

## 2019-01-27 LAB — HIV ANTIBODY (ROUTINE TESTING W REFLEX): HIV Screen 4th Generation wRfx: NONREACTIVE

## 2019-01-27 LAB — RPR: RPR Ser Ql: NONREACTIVE

## 2019-01-27 MED ORDER — ACCU-CHEK GUIDE W/DEVICE KIT: 1.0000 | PACK | Freq: Four times a day (QID) | 0 refills | Status: DC

## 2019-01-27 MED ORDER — ACCU-CHEK FASTCLIX LANCETS MISC: 1.0000 [IU] | Freq: Four times a day (QID) | 12 refills | Status: DC

## 2019-01-27 MED ORDER — ACCU-CHEK GUIDE VI STRP: ORAL_STRIP | 12 refills | Status: DC

## 2019-01-27 NOTE — Telephone Encounter (Signed)
Left message for pt to call to discuss her lab results.

## 2019-02-03 ENCOUNTER — Other Ambulatory Visit: Payer: No Typology Code available for payment source

## 2019-02-07 ENCOUNTER — Telehealth: Payer: Self-pay | Admitting: *Deleted

## 2019-02-07 NOTE — Telephone Encounter (Signed)
Called pt and left message regarding her diabetes and nutrition education appointment. Asked pt to please call that office back to get that rescheduled and if she has any other questions she can call me back her at the office.

## 2019-02-10 ENCOUNTER — Encounter (HOSPITAL_COMMUNITY): Payer: Self-pay

## 2019-02-10 ENCOUNTER — Ambulatory Visit (HOSPITAL_COMMUNITY): Payer: Commercial Managed Care - PPO | Admitting: *Deleted

## 2019-02-10 ENCOUNTER — Ambulatory Visit (HOSPITAL_COMMUNITY)
Admission: RE | Admit: 2019-02-10 | Discharge: 2019-02-10 | Disposition: A | Discharge status: Home / Self Care | Payer: Commercial Managed Care - PPO | Source: Ambulatory Visit | Attending: Maternal & Fetal Medicine | Admitting: Maternal & Fetal Medicine

## 2019-02-10 ENCOUNTER — Other Ambulatory Visit: Payer: Self-pay

## 2019-02-10 VITALS — BP 116/67 | HR 77 | Temp 98.6°F

## 2019-02-10 DIAGNOSIS — O24419 Gestational diabetes mellitus in pregnancy, unspecified control: Secondary | ICD-10-CM | POA: Diagnosis not present

## 2019-02-10 DIAGNOSIS — O99213 Obesity complicating pregnancy, third trimester: Secondary | ICD-10-CM

## 2019-02-10 DIAGNOSIS — O24410 Gestational diabetes mellitus in pregnancy, diet controlled: Secondary | ICD-10-CM

## 2019-02-10 DIAGNOSIS — Z362 Encounter for other antenatal screening follow-up: Secondary | ICD-10-CM

## 2019-02-10 DIAGNOSIS — Z3A32 32 weeks gestation of pregnancy: Secondary | ICD-10-CM

## 2019-02-10 HISTORY — DX: Gestational diabetes mellitus in pregnancy, unspecified control: O24.419

## 2019-02-11 ENCOUNTER — Other Ambulatory Visit (HOSPITAL_COMMUNITY): Payer: Self-pay | Admitting: *Deleted

## 2019-02-11 DIAGNOSIS — O24410 Gestational diabetes mellitus in pregnancy, diet controlled: Secondary | ICD-10-CM

## 2019-02-11 MED ORDER — ACCU-CHEK GUIDE W/DEVICE KIT: 1.0000 | PACK | Freq: Four times a day (QID) | 0 refills | Status: DC

## 2019-02-11 MED ORDER — ACCU-CHEK GUIDE VI STRP: ORAL_STRIP | 12 refills | Status: DC

## 2019-02-11 MED ORDER — ACCU-CHEK FASTCLIX LANCETS MISC: 1.0000 [IU] | Freq: Four times a day (QID) | 12 refills | Status: DC

## 2019-02-16 ENCOUNTER — Telehealth: Payer: No Typology Code available for payment source | Admitting: Family Medicine

## 2019-02-16 ENCOUNTER — Telehealth (INDEPENDENT_AMBULATORY_CARE_PROVIDER_SITE_OTHER): Payer: Medicaid Other | Admitting: Family Medicine

## 2019-02-16 ENCOUNTER — Encounter: Payer: Self-pay | Admitting: Family Medicine

## 2019-02-16 DIAGNOSIS — O289 Unspecified abnormal findings on antenatal screening of mother: Secondary | ICD-10-CM

## 2019-02-16 DIAGNOSIS — O0993 Supervision of high risk pregnancy, unspecified, third trimester: Secondary | ICD-10-CM

## 2019-02-16 DIAGNOSIS — O99820 Streptococcus B carrier state complicating pregnancy: Secondary | ICD-10-CM

## 2019-02-16 DIAGNOSIS — Z3A33 33 weeks gestation of pregnancy: Secondary | ICD-10-CM

## 2019-02-16 DIAGNOSIS — O24415 Gestational diabetes mellitus in pregnancy, controlled by oral hypoglycemic drugs: Secondary | ICD-10-CM

## 2019-02-16 MED ORDER — METFORMIN HCL 500 MG PO TABS: 500.0000 mg | ORAL_TABLET | Freq: Two times a day (BID) | ORAL | 3 refills | Status: DC

## 2019-02-16 NOTE — Progress Notes (Signed)
I connected with  Jeannie Done on 02/16/19 at 11:30 AM EST by telephone and verified that I am speaking with the correct person using two identifiers.   I discussed the limitations, risks, security and privacy concerns of performing an evaluation and management service by telephone and the availability of in person appointments. I also discussed with the patient that there may be a patient responsible charge related to this service. The patient expressed understanding and agreed to proceed.  Alazne Quant Jeanella Anton, CMA 02/16/2019  11:42 AM

## 2019-02-16 NOTE — Progress Notes (Signed)
I connected with@ on 02/16/19 at 11:30 AM EST by: Mychart video and verified that I am speaking with the correct person using two identifiers.  Patient is located at car and provider is located at [facility].     The purpose of this virtual visit is to provide medical care while limiting exposure to the novel coronavirus. I discussed the limitations, risks, security and privacy concerns of performing an evaluation and management service by Mychart and the availability of in person appointments. I also discussed with the patient that there may be a patient responsible charge related to this service. By engaging in this virtual visit, you consent to the provision of healthcare.  Additionally, you authorize for your insurance to be billed for the services provided during this visit.  The patient expressed understanding and agreed to proceed.  The following staff members participated in the virtual visit:  [place]    PRENATAL VISIT NOTE  Subjective:  Sharon Solis is a 28 y.o. G3P2002 at [redacted]w[redacted]d  for phone visit for ongoing prenatal care.  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  Patient reports no complaints.  Contractions: Not present.  .  Movement: Present. Denies leaking of fluid.   The following portions of the patient's history were reviewed and updated as appropriate: allergies, current medications, past family history, past medical history, past social history, past surgical history and problem list.   Objective:  There were no vitals filed for this visit. Self-Obtained  Fetal Status:     Movement: Present     Fasting- for the last week < 130 , range 112, 114, 118,104.  Consistently  2hr PP- 109  (90-134)   Assessment and Plan:  Pregnancy: G3P2002 at [redacted]w[redacted]d 1. Gestational diabetes mellitus (GDM) treated with oral hypoglycemic therapy Reviewed poor glycemic control with mostly elevated fastings. 100% over 110. Recommended insulin therapy and engaged in shared decision making about insulin vs oral agent. Patient desires oral agent trial.  - Reviewed timing of delivery as IOL at [place] - Discussed need for antenatal testing given higher risk category - metFORMIN (GLUCOPHAGE) 500 MG tablet; Take 1 tablet (500 mg total) by mouth 2 (two) times daily with a meal.  Dispense: 60 tablet; Refill: 3 - [place] MFM FETAL BPP W/NONSTRESS; Future  2. Group B streptococcal carriage in urine complicating pregnancy PCN in labor  3. Encounter for supervision of other normal pregnancy in third trimester Up to date on routine PNC - [place] MFM FETAL BPP W/NONSTRESS; Future   Preterm labor symptoms and general obstetric precautions including but not limited to vaginal bleeding, contractions, leaking of fluid and fetal movement were reviewed in detail with the patient.  Return in about 2 weeks (around 03/02/2019) for Routine prenatal care, in person.  Future Appointments  Date Time Provider [place]  03/10/2019  3:45 PM [place] 2 WH-MFCUS MFC-US  03/10/2019  3:50 PM [place] [facility]     Time spent on virtual visit: 15 minutes  Caren Macadam, MD

## 2019-03-05 ENCOUNTER — Encounter: Payer: Self-pay | Admitting: Family Medicine

## 2019-03-10 ENCOUNTER — Encounter (HOSPITAL_COMMUNITY): Payer: Self-pay

## 2019-03-10 ENCOUNTER — Other Ambulatory Visit: Payer: Self-pay

## 2019-03-10 ENCOUNTER — Other Ambulatory Visit (HOSPITAL_COMMUNITY): Payer: Self-pay | Admitting: Obstetrics and Gynecology

## 2019-03-10 ENCOUNTER — Ambulatory Visit (HOSPITAL_COMMUNITY): Payer: Commercial Managed Care - PPO | Admitting: *Deleted

## 2019-03-10 ENCOUNTER — Ambulatory Visit (HOSPITAL_COMMUNITY)
Admission: RE | Admit: 2019-03-10 | Discharge: 2019-03-10 | Disposition: A | Discharge status: Home / Self Care | Payer: Commercial Managed Care - PPO | Source: Ambulatory Visit | Attending: Obstetrics and Gynecology | Admitting: Obstetrics and Gynecology

## 2019-03-10 DIAGNOSIS — O24410 Gestational diabetes mellitus in pregnancy, diet controlled: Secondary | ICD-10-CM | POA: Diagnosis not present

## 2019-03-10 DIAGNOSIS — Z362 Encounter for other antenatal screening follow-up: Secondary | ICD-10-CM

## 2019-03-10 DIAGNOSIS — O24415 Gestational diabetes mellitus in pregnancy, controlled by oral hypoglycemic drugs: Secondary | ICD-10-CM

## 2019-03-10 DIAGNOSIS — O99213 Obesity complicating pregnancy, third trimester: Secondary | ICD-10-CM

## 2019-03-10 DIAGNOSIS — Z3A36 36 weeks gestation of pregnancy: Secondary | ICD-10-CM

## 2019-03-10 DIAGNOSIS — O0990 Supervision of high risk pregnancy, unspecified, unspecified trimester: Secondary | ICD-10-CM | POA: Diagnosis not present

## 2019-03-11 ENCOUNTER — Other Ambulatory Visit (HOSPITAL_COMMUNITY): Payer: Self-pay | Admitting: *Deleted

## 2019-03-11 DIAGNOSIS — O24415 Gestational diabetes mellitus in pregnancy, controlled by oral hypoglycemic drugs: Secondary | ICD-10-CM

## 2019-03-16 ENCOUNTER — Encounter: Payer: Self-pay | Admitting: Family Medicine

## 2019-03-16 ENCOUNTER — Ambulatory Visit (INDEPENDENT_AMBULATORY_CARE_PROVIDER_SITE_OTHER): Payer: No Typology Code available for payment source | Admitting: Family Medicine

## 2019-03-16 ENCOUNTER — Other Ambulatory Visit (HOSPITAL_COMMUNITY)
Admission: RE | Admit: 2019-03-16 | Discharge: 2019-03-16 | Disposition: A | Discharge status: Home / Self Care | Payer: Commercial Managed Care - PPO | Source: Ambulatory Visit | Attending: Family Medicine | Admitting: Family Medicine

## 2019-03-16 ENCOUNTER — Other Ambulatory Visit: Payer: Self-pay

## 2019-03-16 VITALS — BP 124/78 | HR 72 | Wt 250.0 lb

## 2019-03-16 DIAGNOSIS — O0990 Supervision of high risk pregnancy, unspecified, unspecified trimester: Secondary | ICD-10-CM | POA: Diagnosis not present

## 2019-03-16 DIAGNOSIS — O24415 Gestational diabetes mellitus in pregnancy, controlled by oral hypoglycemic drugs: Secondary | ICD-10-CM

## 2019-03-16 DIAGNOSIS — O0993 Supervision of high risk pregnancy, unspecified, third trimester: Secondary | ICD-10-CM

## 2019-03-16 DIAGNOSIS — O99820 Streptococcus B carrier state complicating pregnancy: Secondary | ICD-10-CM

## 2019-03-16 DIAGNOSIS — Z3A37 37 weeks gestation of pregnancy: Secondary | ICD-10-CM

## 2019-03-16 NOTE — Progress Notes (Signed)
   PRENATAL VISIT NOTE  Subjective:  Sharon Solis is a 28 y.o. G3P2002 at [redacted]w[redacted]d being seen today for ongoing prenatal care.  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  Patient reports no complaints. 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  Contractions: Irritability.  . No vaginal bleeding.  Movement: Present. Denies leaking of fluid. Patient reports fasting and 2 hr glucose levels <100 via home glucometer.   The following portions of the patient's history were reviewed and updated as appropriate: allergies, current medications, past family history, past medical history, past social history, past surgical history and problem list.   Objective:   Vitals:   03/16/19 1420  BP: 124/78  Pulse: 72  Weight: 250 lb (113.4 kg)    Fetal Status: Fetal Heart Rate (bpm): 123   Movement: Present     Fundal height: 38cm TWG: 20 lb (9.072 kg)  General:  Alert, oriented and cooperative. Patient is in no acute distress.  Skin: Skin is warm and dry. No rash noted.   Cardiovascular: Normal heart rate noted  Respiratory: Normal respiratory effort, no problems with respiration noted  Abdomen: Soft, gravid, appropriate for gestational age.  Pain/Pressure: Absent     Pelvic: Cervical exam performed. 2 cm cervical dilation.        Extremities: Normal range of motion.  Edema: Trace  Mental Status: Normal mood and affect. Normal behavior. Normal judgment and thought content.   Assessment and Plan:  Pregnancy: G3P2002 at [redacted]w[redacted]d  1. Supervision of high  risk pregnancy, antepartum Ms. Grindle is a 28yo [id number] with a history of GDM during this pregnancy who presents for prenatal visit at 75 weeks. She is progressing well in her pregnancy. FH 38cm. We discussed that the decision to pursue COVID vaccine during pregnancy is individualized based on benefits and risks. Advised patient that per ACOG and Society of Maternal-Fetal medicine recommendations, there is no reason to withhold COVID-19 vaccines from pregnant pts who meet criteria for receiving the vaccine. - GC/chlamydia swab - GBS positive (10/18/18). Plan for PCN during labor - [place] MFM Fetal BPP W/ Nonstress tomorrow (12/17) - IOL scheduled for 39 weeks  2. Group B streptococcal carriage in urine complicating pregnancy Plan for PCN during labor.  3. Gestational diabetes mellitus (GDM) treated with oral hypoglycemic therapy - Continue Metformin 500mg  BID - Encouraged continued healthy diet and exercise - IOL at 39 wks due to A2GDM  Term labor symptoms and general obstetric precautions including but not limited to vaginal bleeding, contractions, leaking of fluid and fetal movement were reviewed in detail with the patient. Please refer to After Visit Summary for other counseling recommendations.   Plan for follow-up in 1 week via virtual visit, or as needed.  Future Appointments  Date Time Provider [place]  03/17/2019  2:45 PM [place] 2 WH-MFCUS MFC-US  03/17/2019  2:50 PM [place] [facility]-US  03/24/2019  9:00 AM WH-MFC [place] 4 WH-MFCUS MFC-US  03/24/2019  9:15 AM [place] MFC-US    Cristela Felt, Medical Student

## 2019-03-17 ENCOUNTER — Ambulatory Visit (HOSPITAL_COMMUNITY)
Admission: RE | Admit: 2019-03-17 | Discharge: 2019-03-17 | Disposition: A | Discharge status: Home / Self Care | Payer: Medicaid Other | Source: Ambulatory Visit | Attending: Obstetrics and Gynecology | Admitting: Obstetrics and Gynecology

## 2019-03-17 ENCOUNTER — Encounter (HOSPITAL_COMMUNITY): Payer: Self-pay

## 2019-03-17 ENCOUNTER — Ambulatory Visit (HOSPITAL_COMMUNITY): Payer: Medicaid Other

## 2019-03-17 DIAGNOSIS — O24415 Gestational diabetes mellitus in pregnancy, controlled by oral hypoglycemic drugs: Secondary | ICD-10-CM

## 2019-03-18 LAB — GC/CHLAMYDIA PROBE AMP (~~LOC~~) NOT AT ARMC
Chlamydia: NEGATIVE
Comment: NEGATIVE
Comment: NORMAL
Neisseria Gonorrhea: NEGATIVE

## 2019-03-19 ENCOUNTER — Encounter: Payer: Self-pay | Admitting: Family Medicine

## 2019-03-21 ENCOUNTER — Telehealth (HOSPITAL_COMMUNITY): Payer: Self-pay | Admitting: *Deleted

## 2019-03-21 NOTE — Telephone Encounter (Signed)
Preadmission screen  

## 2019-03-22 ENCOUNTER — Encounter (HOSPITAL_COMMUNITY): Payer: Self-pay | Admitting: *Deleted

## 2019-03-22 ENCOUNTER — Telehealth (HOSPITAL_COMMUNITY): Payer: Self-pay | Admitting: *Deleted

## 2019-03-22 NOTE — Telephone Encounter (Signed)
Preadmission screen  

## 2019-03-23 ENCOUNTER — Other Ambulatory Visit: Payer: Self-pay

## 2019-03-23 ENCOUNTER — Telehealth: Payer: No Typology Code available for payment source | Admitting: Certified Nurse Midwife

## 2019-03-23 ENCOUNTER — Telehealth: Payer: Self-pay | Admitting: Radiology

## 2019-03-23 DIAGNOSIS — O0990 Supervision of high risk pregnancy, unspecified, unspecified trimester: Secondary | ICD-10-CM

## 2019-03-23 DIAGNOSIS — O99820 Streptococcus B carrier state complicating pregnancy: Secondary | ICD-10-CM

## 2019-03-23 DIAGNOSIS — O24415 Gestational diabetes mellitus in pregnancy, controlled by oral hypoglycemic drugs: Secondary | ICD-10-CM

## 2019-03-23 NOTE — Progress Notes (Signed)
Patient not seen by provider d/t patient unable to hear provider as she was in the grocery store. Will reschedule appointment.   Lajean Manes, CNM 03/23/19, 3:30 PM

## 2019-03-23 NOTE — Progress Notes (Signed)
I connected with  Sharon Solis on 03/23/19 at  1:30 PM EST by telephone and verified that I am speaking with the correct person using two identifiers.   I discussed the limitations, risks, security and privacy concerns of performing an evaluation and management service by telephone and the availability of in person appointments. I also discussed with the patient that there may be a patient responsible charge related to this service. The patient expressed understanding and agreed to proceed.  Crosby Oyster, RN 03/23/2019  1:52 PM

## 2019-03-23 NOTE — Telephone Encounter (Signed)
Called patient and left voicemail to call [facility] for virtual visit. No answer.

## 2019-03-24 ENCOUNTER — Ambulatory Visit (HOSPITAL_COMMUNITY)
Admission: RE | Admit: 2019-03-24 | Discharge: 2019-03-24 | Disposition: A | Discharge status: Home / Self Care | Payer: Commercial Managed Care - PPO | Source: Ambulatory Visit | Attending: Obstetrics and Gynecology | Admitting: Obstetrics and Gynecology

## 2019-03-24 ENCOUNTER — Encounter (HOSPITAL_COMMUNITY): Payer: Self-pay | Admitting: *Deleted

## 2019-03-24 ENCOUNTER — Ambulatory Visit (HOSPITAL_COMMUNITY): Payer: Commercial Managed Care - PPO | Admitting: *Deleted

## 2019-03-24 ENCOUNTER — Other Ambulatory Visit: Payer: Self-pay

## 2019-03-24 DIAGNOSIS — O0990 Supervision of high risk pregnancy, unspecified, unspecified trimester: Secondary | ICD-10-CM | POA: Diagnosis not present

## 2019-03-24 DIAGNOSIS — O99213 Obesity complicating pregnancy, third trimester: Secondary | ICD-10-CM | POA: Diagnosis not present

## 2019-03-24 DIAGNOSIS — O24415 Gestational diabetes mellitus in pregnancy, controlled by oral hypoglycemic drugs: Secondary | ICD-10-CM | POA: Insufficient documentation

## 2019-03-24 DIAGNOSIS — Z3A38 38 weeks gestation of pregnancy: Secondary | ICD-10-CM | POA: Diagnosis not present

## 2019-03-26 ENCOUNTER — Other Ambulatory Visit: Payer: Self-pay

## 2019-03-28 ENCOUNTER — Other Ambulatory Visit (HOSPITAL_COMMUNITY)
Admission: RE | Admit: 2019-03-28 | Discharge: 2019-03-28 | Disposition: A | Discharge status: Home / Self Care | Payer: Commercial Managed Care - PPO | Source: Ambulatory Visit | Attending: Family Medicine | Admitting: Family Medicine

## 2019-03-28 DIAGNOSIS — Z01812 Encounter for preprocedural laboratory examination: Secondary | ICD-10-CM | POA: Insufficient documentation

## 2019-03-28 DIAGNOSIS — Z20828 Contact with and (suspected) exposure to other viral communicable diseases: Secondary | ICD-10-CM | POA: Insufficient documentation

## 2019-03-28 LAB — SARS CORONAVIRUS 2 (TAT 6-24 HRS): SARS Coronavirus 2: NEGATIVE

## 2019-03-29 ENCOUNTER — Other Ambulatory Visit: Payer: Self-pay | Admitting: Advanced Practice Midwife

## 2019-03-30 ENCOUNTER — Encounter (HOSPITAL_COMMUNITY): Payer: Self-pay | Admitting: Family Medicine

## 2019-03-30 ENCOUNTER — Inpatient Hospital Stay (HOSPITAL_COMMUNITY): Payer: Commercial Managed Care - PPO | Admitting: Certified Registered Nurse Anesthetist

## 2019-03-30 ENCOUNTER — Encounter (HOSPITAL_COMMUNITY)
Admission: AD | Disposition: A | Discharge status: Home / Self Care | Payer: Self-pay | Source: Home / Self Care | Attending: Obstetrics and Gynecology

## 2019-03-30 ENCOUNTER — Inpatient Hospital Stay (HOSPITAL_COMMUNITY): Payer: Commercial Managed Care - PPO | Admitting: Anesthesiology

## 2019-03-30 ENCOUNTER — Other Ambulatory Visit: Payer: Self-pay

## 2019-03-30 ENCOUNTER — Inpatient Hospital Stay (HOSPITAL_COMMUNITY)
Admission: AD | Admit: 2019-03-30 | Discharge: 2019-03-31 | DRG: 798 | Disposition: A | Discharge status: Home / Self Care | Payer: Commercial Managed Care - PPO | Attending: Obstetrics and Gynecology | Admitting: Obstetrics and Gynecology

## 2019-03-30 ENCOUNTER — Inpatient Hospital Stay (HOSPITAL_COMMUNITY): Payer: Commercial Managed Care - PPO

## 2019-03-30 DIAGNOSIS — Z302 Encounter for sterilization: Secondary | ICD-10-CM | POA: Diagnosis not present

## 2019-03-30 DIAGNOSIS — Z87891 Personal history of nicotine dependence: Secondary | ICD-10-CM

## 2019-03-30 DIAGNOSIS — O0990 Supervision of high risk pregnancy, unspecified, unspecified trimester: Secondary | ICD-10-CM

## 2019-03-30 DIAGNOSIS — Z3009 Encounter for other general counseling and advice on contraception: Secondary | ICD-10-CM

## 2019-03-30 DIAGNOSIS — Z20828 Contact with and (suspected) exposure to other viral communicable diseases: Secondary | ICD-10-CM | POA: Diagnosis present

## 2019-03-30 DIAGNOSIS — Z3A39 39 weeks gestation of pregnancy: Secondary | ICD-10-CM

## 2019-03-30 DIAGNOSIS — O99214 Obesity complicating childbirth: Secondary | ICD-10-CM | POA: Diagnosis present

## 2019-03-30 DIAGNOSIS — O99824 Streptococcus B carrier state complicating childbirth: Secondary | ICD-10-CM | POA: Diagnosis present

## 2019-03-30 DIAGNOSIS — O24425 Gestational diabetes mellitus in childbirth, controlled by oral hypoglycemic drugs: Secondary | ICD-10-CM | POA: Diagnosis present

## 2019-03-30 DIAGNOSIS — O24424 Gestational diabetes mellitus in childbirth, insulin controlled: Secondary | ICD-10-CM

## 2019-03-30 DIAGNOSIS — O24415 Gestational diabetes mellitus in pregnancy, controlled by oral hypoglycemic drugs: Secondary | ICD-10-CM

## 2019-03-30 DIAGNOSIS — O99820 Streptococcus B carrier state complicating pregnancy: Secondary | ICD-10-CM

## 2019-03-30 HISTORY — PX: TUBAL LIGATION: SHX77

## 2019-03-30 LAB — CBC
HCT: 35.9 % — ABNORMAL LOW (ref 36.0–46.0)
Hemoglobin: 12 g/dL (ref 12.0–15.0)
MCH: 29.6 pg (ref 26.0–34.0)
MCHC: 33.4 g/dL (ref 30.0–36.0)
MCV: 88.6 fL (ref 80.0–100.0)
Platelets: 210 10*3/uL (ref 150–400)
RBC: 4.05 MIL/uL (ref 3.87–5.11)
RDW: 13.3 % (ref 11.5–15.5)
WBC: 12 10*3/uL — ABNORMAL HIGH (ref 4.0–10.5)
nRBC: 0 % (ref 0.0–0.2)

## 2019-03-30 LAB — TYPE AND SCREEN
ABO/RH(D): A POS
Antibody Screen: NEGATIVE

## 2019-03-30 LAB — GLUCOSE, CAPILLARY
Glucose-Capillary: 107 mg/dL — ABNORMAL HIGH (ref 70–99)
Glucose-Capillary: 93 mg/dL (ref 70–99)
Glucose-Capillary: 91 mg/dL (ref 70–99)
Glucose-Capillary: 82 mg/dL (ref 70–99)
Glucose-Capillary: 84 mg/dL (ref 70–99)

## 2019-03-30 LAB — RPR: RPR Ser Ql: NONREACTIVE

## 2019-03-30 LAB — ABO/RH: ABO/RH(D): A POS

## 2019-03-30 SURGERY — LIGATION, FALLOPIAN TUBE, POSTPARTUM
Anesthesia: Regional | Laterality: Bilateral

## 2019-03-30 SURGERY — LIGATION, FALLOPIAN TUBE, POSTPARTUM
Anesthesia: Epidural | Site: Abdomen | Laterality: Bilateral | Wound class: Clean Contaminated

## 2019-03-30 MED ORDER — DIPHENHYDRAMINE HCL 25 MG PO CAPS: 25.0000 mg | ORAL_CAPSULE | Freq: Four times a day (QID) | ORAL | Status: DC | PRN

## 2019-03-30 MED ORDER — ONDANSETRON HCL 4 MG/2ML IJ SOLN: 4.0000 mg | INTRAMUSCULAR | Status: DC | PRN

## 2019-03-30 MED ORDER — LIDOCAINE HCL (PF) 1 % IJ SOLN: INTRAMUSCULAR | Status: DC | PRN

## 2019-03-30 MED ORDER — TETANUS-DIPHTH-ACELL PERTUSSIS 5-2.5-18.5 LF-MCG/0.5 IM SUSP: 0.5000 mL | Freq: Once | INTRAMUSCULAR | Status: DC

## 2019-03-30 MED ORDER — DIPHENHYDRAMINE HCL 50 MG/ML IJ SOLN: 12.5000 mg | INTRAMUSCULAR | Status: DC | PRN

## 2019-03-30 MED ORDER — OXYCODONE HCL 5 MG PO TABS: 5.0000 mg | ORAL_TABLET | ORAL | Status: DC | PRN

## 2019-03-30 MED ORDER — PHENYLEPHRINE 40 MCG/ML (10ML) SYRINGE FOR IV PUSH (FOR BLOOD PRESSURE SUPPORT): 80.0000 ug | PREFILLED_SYRINGE | INTRAVENOUS | Status: DC | PRN

## 2019-03-30 MED ORDER — COCONUT OIL OIL: 1.0000 "application " | TOPICAL_OIL | Status: DC | PRN

## 2019-03-30 MED ORDER — SOD CITRATE-CITRIC ACID 500-334 MG/5ML PO SOLN
30.0000 mL | ORAL | Status: DC | PRN
  Administered 2019-03-30: 30 mL via ORAL
  Filled 2019-03-30: qty 30

## 2019-03-30 MED ORDER — BUPIVACAINE HCL (PF) 0.25 % IJ SOLN
INTRAMUSCULAR | Status: AC
  Filled 2019-03-30: qty 10

## 2019-03-30 MED ORDER — SODIUM CHLORIDE (PF) 0.9 % IJ SOLN
INTRAMUSCULAR | Status: DC | PRN
  Administered 2019-03-30: 12 mL/h via EPIDURAL

## 2019-03-30 MED ORDER — LACTATED RINGERS IV SOLN: INTRAVENOUS | Status: DC

## 2019-03-30 MED ORDER — OXYTOCIN 40 UNITS IN NORMAL SALINE INFUSION - SIMPLE MED
1.0000 m[IU]/min | INTRAVENOUS | Status: DC
  Administered 2019-03-30: 06:00:00 2 m[IU]/min via INTRAVENOUS

## 2019-03-30 MED ORDER — ZOLPIDEM TARTRATE 5 MG PO TABS: 5.0000 mg | ORAL_TABLET | Freq: Every evening | ORAL | Status: DC | PRN

## 2019-03-30 MED ORDER — ONDANSETRON HCL 4 MG/2ML IJ SOLN: 4.0000 mg | Freq: Four times a day (QID) | INTRAMUSCULAR | Status: DC | PRN

## 2019-03-30 MED ORDER — LIDOCAINE HCL (PF) 1 % IJ SOLN: 30.0000 mL | INTRAMUSCULAR | Status: DC | PRN

## 2019-03-30 MED ORDER — EPHEDRINE 5 MG/ML INJ: 10.0000 mg | INTRAVENOUS | Status: DC | PRN

## 2019-03-30 MED ORDER — SIMETHICONE 80 MG PO CHEW: 80.0000 mg | CHEWABLE_TABLET | ORAL | Status: DC | PRN

## 2019-03-30 MED ORDER — ACETAMINOPHEN 325 MG PO TABS
650.0000 mg | ORAL_TABLET | ORAL | Status: DC | PRN
  Administered 2019-03-31: 650 mg via ORAL
  Filled 2019-03-30: qty 2

## 2019-03-30 MED ORDER — OXYCODONE-ACETAMINOPHEN 5-325 MG PO TABS: 2.0000 | ORAL_TABLET | ORAL | Status: DC | PRN

## 2019-03-30 MED ORDER — OXYTOCIN BOLUS FROM INFUSION
500.0000 mL | Freq: Once | INTRAVENOUS | Status: AC
  Administered 2019-03-30: 12:00:00 500 mL/h via INTRAVENOUS

## 2019-03-30 MED ORDER — ACETAMINOPHEN 325 MG PO TABS: 325.0000 mg | ORAL_TABLET | ORAL | Status: DC | PRN

## 2019-03-30 MED ORDER — SENNOSIDES-DOCUSATE SODIUM 8.6-50 MG PO TABS
2.0000 | ORAL_TABLET | ORAL | Status: DC
  Administered 2019-03-31: 2 via ORAL
  Filled 2019-03-30: qty 2

## 2019-03-30 MED ORDER — ONDANSETRON HCL 4 MG/2ML IJ SOLN
INTRAMUSCULAR | Status: AC
  Filled 2019-03-30: qty 2

## 2019-03-30 MED ORDER — TERBUTALINE SULFATE 1 MG/ML IJ SOLN: 0.2500 mg | Freq: Once | INTRAMUSCULAR | Status: DC | PRN

## 2019-03-30 MED ORDER — IBUPROFEN 600 MG PO TABS
600.0000 mg | ORAL_TABLET | Freq: Four times a day (QID) | ORAL | Status: DC
  Administered 2019-03-30 – 2019-03-31 (×4): 600 mg via ORAL
  Filled 2019-03-30 (×4): qty 1

## 2019-03-30 MED ORDER — FENTANYL-BUPIVACAINE-NACL 0.5-0.125-0.9 MG/250ML-% EP SOLN
12.0000 mL/h | EPIDURAL | Status: DC | PRN
  Filled 2019-03-30: qty 250

## 2019-03-30 MED ORDER — MIDAZOLAM HCL 2 MG/2ML IJ SOLN
INTRAMUSCULAR | Status: AC
  Filled 2019-03-30: qty 2

## 2019-03-30 MED ORDER — OXYCODONE HCL 5 MG/5ML PO SOLN: 5.0000 mg | Freq: Once | ORAL | Status: DC | PRN

## 2019-03-30 MED ORDER — WITCH HAZEL-GLYCERIN EX PADS: 1.0000 "application " | MEDICATED_PAD | CUTANEOUS | Status: DC | PRN

## 2019-03-30 MED ORDER — FENTANYL CITRATE (PF) 100 MCG/2ML IJ SOLN: 25.0000 ug | INTRAMUSCULAR | Status: DC | PRN

## 2019-03-30 MED ORDER — ACETAMINOPHEN 160 MG/5ML PO SOLN: 325.0000 mg | ORAL | Status: DC | PRN

## 2019-03-30 MED ORDER — MISOPROSTOL 25 MCG QUARTER TABLET
25.0000 ug | ORAL_TABLET | ORAL | Status: DC | PRN
  Administered 2019-03-30: 01:00:00 25 ug via VAGINAL
  Filled 2019-03-30: qty 1

## 2019-03-30 MED ORDER — OXYCODONE-ACETAMINOPHEN 5-325 MG PO TABS: 1.0000 | ORAL_TABLET | ORAL | Status: DC | PRN

## 2019-03-30 MED ORDER — SODIUM CHLORIDE 0.9 % IR SOLN
Status: DC | PRN
  Administered 2019-03-30: 1

## 2019-03-30 MED ORDER — LACTATED RINGERS IV SOLN
500.0000 mL | INTRAVENOUS | Status: DC | PRN
  Administered 2019-03-30: 500 mL via INTRAVENOUS

## 2019-03-30 MED ORDER — ACETAMINOPHEN 325 MG PO TABS: 650.0000 mg | ORAL_TABLET | ORAL | Status: DC | PRN

## 2019-03-30 MED ORDER — ONDANSETRON HCL 4 MG PO TABS: 4.0000 mg | ORAL_TABLET | ORAL | Status: DC | PRN

## 2019-03-30 MED ORDER — PENICILLIN G POT IN DEXTROSE 60000 UNIT/ML IV SOLN
3.0000 10*6.[IU] | INTRAVENOUS | Status: DC
  Administered 2019-03-30 (×2): 3 10*6.[IU] via INTRAVENOUS
  Filled 2019-03-30 (×2): qty 50

## 2019-03-30 MED ORDER — BENZOCAINE-MENTHOL 20-0.5 % EX AERO
1.0000 "application " | INHALATION_SPRAY | CUTANEOUS | Status: DC | PRN
  Administered 2019-03-31: 1 via TOPICAL
  Filled 2019-03-30: qty 56

## 2019-03-30 MED ORDER — FENTANYL CITRATE (PF) 100 MCG/2ML IJ SOLN
INTRAMUSCULAR | Status: DC | PRN
  Administered 2019-03-30: 100 ug via INTRAVENOUS

## 2019-03-30 MED ORDER — DIBUCAINE (PERIANAL) 1 % EX OINT: 1.0000 "application " | TOPICAL_OINTMENT | CUTANEOUS | Status: DC | PRN

## 2019-03-30 MED ORDER — MEASLES, MUMPS & RUBELLA VAC IJ SOLR: 0.5000 mL | Freq: Once | INTRAMUSCULAR | Status: DC

## 2019-03-30 MED ORDER — PRENATAL MULTIVITAMIN CH
1.0000 | ORAL_TABLET | Freq: Every day | ORAL | Status: DC
  Administered 2019-03-31: 12:00:00 1 via ORAL
  Filled 2019-03-30: qty 1

## 2019-03-30 MED ORDER — SODIUM CHLORIDE 0.9 % IV SOLN
5.0000 10*6.[IU] | Freq: Once | INTRAVENOUS | Status: AC
  Administered 2019-03-30: 01:00:00 5 10*6.[IU] via INTRAVENOUS
  Filled 2019-03-30: qty 5

## 2019-03-30 MED ORDER — MIDAZOLAM HCL 2 MG/2ML IJ SOLN
INTRAMUSCULAR | Status: DC | PRN
  Administered 2019-03-30: 2 mg via INTRAVENOUS

## 2019-03-30 MED ORDER — OXYCODONE HCL 5 MG PO TABS: 10.0000 mg | ORAL_TABLET | ORAL | Status: DC | PRN

## 2019-03-30 MED ORDER — ONDANSETRON HCL 4 MG/2ML IJ SOLN
INTRAMUSCULAR | Status: DC | PRN
  Administered 2019-03-30: 4 mg via INTRAVENOUS

## 2019-03-30 MED ORDER — LIDOCAINE HCL (PF) 1 % IJ SOLN
INTRAMUSCULAR | Status: DC | PRN
  Administered 2019-03-30: 6 mg via EPIDURAL

## 2019-03-30 MED ORDER — OXYTOCIN 40 UNITS IN NORMAL SALINE INFUSION - SIMPLE MED
2.5000 [IU]/h | INTRAVENOUS | Status: DC
  Filled 2019-03-30: qty 1000

## 2019-03-30 MED ORDER — MEPERIDINE HCL 25 MG/ML IJ SOLN: 6.2500 mg | INTRAMUSCULAR | Status: DC | PRN

## 2019-03-30 MED ORDER — OXYCODONE HCL 5 MG PO TABS: 5.0000 mg | ORAL_TABLET | Freq: Once | ORAL | Status: DC | PRN

## 2019-03-30 MED ORDER — FENTANYL CITRATE (PF) 100 MCG/2ML IJ SOLN
INTRAMUSCULAR | Status: AC
  Filled 2019-03-30: qty 2

## 2019-03-30 MED ORDER — BUPIVACAINE HCL (PF) 0.25 % IJ SOLN
INTRAMUSCULAR | Status: DC | PRN
  Administered 2019-03-30: 10 mL

## 2019-03-30 MED ORDER — LACTATED RINGERS IV SOLN: 500.0000 mL | Freq: Once | INTRAVENOUS | Status: DC

## 2019-03-30 MED ORDER — ONDANSETRON HCL 4 MG/2ML IJ SOLN: 4.0000 mg | Freq: Once | INTRAMUSCULAR | Status: DC | PRN

## 2019-03-30 MED ORDER — LIDOCAINE-EPINEPHRINE (PF) 2 %-1:200000 IJ SOLN
INTRAMUSCULAR | Status: DC | PRN
  Administered 2019-03-30 (×2): 5 mL via INTRADERMAL

## 2019-03-30 SURGICAL SUPPLY — 22 items
BLADE SURG 11 STRL SS (BLADE) ×2 IMPLANT
CLIP FILSHIE TUBAL LIGA STRL (Clip) ×2 IMPLANT
DRSG OPSITE POSTOP 3X4 (GAUZE/BANDAGES/DRESSINGS) ×2 IMPLANT
DURAPREP 26ML APPLICATOR (WOUND CARE) ×2 IMPLANT
GLOVE BIOGEL PI IND STRL 7.0 (GLOVE) ×1 IMPLANT
GLOVE BIOGEL PI IND STRL 7.5 (GLOVE) ×1 IMPLANT
GLOVE BIOGEL PI INDICATOR 7.0 (GLOVE) ×1
GLOVE BIOGEL PI INDICATOR 7.5 (GLOVE) ×1
GLOVE ECLIPSE 7.5 STRL STRAW (GLOVE) ×2 IMPLANT
GOWN STRL REUS W/TWL LRG LVL3 (GOWN DISPOSABLE) ×4 IMPLANT
NEEDLE HYPO 22GX1.5 SAFETY (NEEDLE) ×2 IMPLANT
NS IRRIG 1000ML POUR BTL (IV SOLUTION) ×2 IMPLANT
PACK ABDOMINAL MINOR (CUSTOM PROCEDURE TRAY) ×2 IMPLANT
PROTECTOR NERVE ULNAR (MISCELLANEOUS) ×2 IMPLANT
SPONGE LAP 18X18 RF (DISPOSABLE) ×2 IMPLANT
SPONGE LAP 4X18 RFD (DISPOSABLE) IMPLANT
SUT PLAIN 0 NONE (SUTURE) IMPLANT
SUT VICRYL 0 UR6 27IN ABS (SUTURE) ×2 IMPLANT
SUT VICRYL 4-0 PS2 18IN ABS (SUTURE) ×2 IMPLANT
SYR CONTROL 10ML LL (SYRINGE) ×2 IMPLANT
TOWEL OR 17X24 6PK STRL BLUE (TOWEL DISPOSABLE) ×4 IMPLANT
TRAY FOLEY W/BAG SLVR 14FR (SET/KITS/TRAYS/PACK) ×2 IMPLANT

## 2019-03-30 NOTE — Progress Notes (Signed)
Patient desires permanent sterilization.  Other reversible forms of contraception were discussed with patient; she declines all other modalities. Given patient's relative young age I made sure to fully discuss alternate options, and also vasectomy, risk of regret, et.   Risks of procedure discussed with patient including but not limited to: risk of regret, permanence of method, bleeding, infection, injury to surrounding organs and need for additional procedures.  Failure risk of 1-2 % with increased risk of ectopic gestation if pregnancy occurs was also discussed with patient.  Patient verbalized understanding of these risks and wants to proceed with sterilization.  Written informed consent obtained.  To OR when ready.

## 2019-03-30 NOTE — Anesthesia Procedure Notes (Signed)
Procedure Name: MAC Performed by: Alain Marion, CRNA Pre-anesthesia Checklist: Patient identified, Emergency Drugs available, Suction available and Patient being monitored Oxygen Delivery Method: Nasal cannula Placement Confirmation: positive ETCO2

## 2019-03-30 NOTE — Progress Notes (Signed)
Labor Progress Note MIAH BOYE is a 28 y.o. G3P2002 at [redacted]w[redacted]d presented for IOL for [place]. S: Starting to get more uncomfortable and thinking about epidural.   O:  BP 106/69   Pulse 78   Temp 98 F (36.7 C) (Oral)   Resp 16   Ht 5\' 6"  (1.676 m)   Wt 116.7 kg   LMP 06/30/2018 (Approximate)   BMI 41.53 kg/m  EFM: 130, moderate variability, pos accels, no decels, reactive Toco: q2-10m  CVE: Dilation: 4.5 Effacement (%): 70 Cervical Position: Posterior Station: -2 Presentation: Vertex Exam by:: [employer] RN   A&P: 28 y.o. [id number] [redacted]w[redacted]d here for [place]. #Labor: Progressing well. S/p Cytotec x1 and Pit started at 0534. Pit currently at 2. Cont Pit titration as needed. Anticipate SVD. #Pain: per patient request #FWB: Cat I #GBS positive; PCN #[id number]: q4h latent and q2h active labor CBG's; last glucose 93  Chauncey Mann, MD 6:45 AM

## 2019-03-30 NOTE — Transfer of Care (Signed)
Immediate Anesthesia Transfer of Care Note  Patient: Sharon Solis  Procedure(s) Performed: POST PARTUM TUBAL LIGATION (Bilateral Abdomen)  Patient Location: PACU  Anesthesia Type:Epidural  Level of Consciousness: awake and alert   Airway & Oxygen Therapy: Patient Spontanous Breathing  Post-op Assessment: Report given to RN and Post -op Vital signs reviewed and stable  Post vital signs: Reviewed and stable  Last Vitals:  Vitals Value Taken Time  BP    Temp    Pulse    Resp    SpO2      Last Pain:  Vitals:   03/30/19 1246  TempSrc: Oral  PainSc: 0-No pain         Complications: No apparent anesthesia complications

## 2019-03-30 NOTE — Anesthesia Postprocedure Evaluation (Signed)
Anesthesia Post Note  Patient: Sharon Solis  Procedure(s) Performed: AN AD HOC LABOR EPIDURAL     Patient location during evaluation: Mother Baby Anesthesia Type: Epidural Level of consciousness: awake and alert Pain management: pain level controlled Vital Signs Assessment: post-procedure vital signs reviewed and stable Respiratory status: spontaneous breathing, nonlabored ventilation and respiratory function stable Cardiovascular status: stable Postop Assessment: no headache, no backache and epidural receding Anesthetic complications: no    Last Vitals:  Vitals:   03/30/19 1520 03/30/19 1646  BP: 112/83 137/76  Pulse: 86 92  Resp: 18 18  Temp: 36.7 C 37 C  SpO2: 100% 98%    Last Pain:  Vitals:   03/30/19 1650  TempSrc:   PainSc: 5    Pain Goal:                Epidural/Spinal Function Cutaneous sensation: Normal sensation (03/30/19 1650), Patient able to flex knees: Yes (03/30/19 1650), Patient able to lift hips off bed: Yes (03/30/19 1650), Back pain beyond tenderness at insertion site: No (03/30/19 1650), Progressively worsening motor and/or sensory loss: No (03/30/19 1650)  Kaiden Dardis

## 2019-03-30 NOTE — Discharge Instructions (Signed)

## 2019-03-30 NOTE — Anesthesia Postprocedure Evaluation (Signed)
Anesthesia Post Note  Patient: Sharon Solis  Procedure(s) Performed: POST PARTUM TUBAL LIGATION (Bilateral Abdomen)     Patient location during evaluation: Mother Baby Anesthesia Type: Epidural Level of consciousness: awake and alert Pain management: pain level controlled Vital Signs Assessment: post-procedure vital signs reviewed and stable Respiratory status: spontaneous breathing, nonlabored ventilation and respiratory function stable Cardiovascular status: stable Postop Assessment: no headache, no backache and epidural receding Anesthetic complications: no    Last Vitals:  Vitals:   03/30/19 1520 03/30/19 1646  BP: 112/83 137/76  Pulse: 86 92  Resp: 18 18  Temp: 36.7 C 37 C  SpO2: 100% 98%    Last Pain:  Vitals:   03/30/19 1650  TempSrc:   PainSc: 5    Pain Goal:                Epidural/Spinal Function Cutaneous sensation: Normal sensation (03/30/19 1650), Patient able to flex knees: Yes (03/30/19 1650), Patient able to lift hips off bed: Yes (03/30/19 1650), Back pain beyond tenderness at insertion site: No (03/30/19 1650), Progressively worsening motor and/or sensory loss: No (03/30/19 1650)  [place]

## 2019-03-30 NOTE — Anesthesia Procedure Notes (Signed)
Epidural Patient location during procedure: OB Start time: 03/30/2019 7:39 AM End time: 03/30/2019 7:44 AM  Staffing Anesthesiologist: Janeece Riggers, MD  Preanesthetic Checklist Completed: patient identified, IV checked, site marked, risks and benefits discussed, surgical consent, monitors and equipment checked, pre-op evaluation and timeout performed  Epidural Patient position: sitting Prep: DuraPrep and site prepped and draped Patient monitoring: continuous pulse ox and blood pressure Approach: midline Location: L3-L4 Injection technique: LOR air  Needle:  Needle type: Tuohy  Needle gauge: 17 G Needle length: 9 cm and 9 Needle insertion depth: 6 cm Catheter type: closed end flexible Catheter size: 19 Gauge Catheter at skin depth: 11 cm Test dose: negative  Assessment Events: blood not aspirated, injection not painful, no injection resistance, no paresthesia and negative IV test

## 2019-03-30 NOTE — Anesthesia Preprocedure Evaluation (Signed)
Anesthesia Evaluation  Patient identified by MRN, date of birth, ID band Patient awake    Reviewed: Allergy & Precautions, H&P , NPO status , Patient's Chart, lab work & pertinent test results, reviewed documented beta blocker date and time   Airway Mallampati: II  TM Distance: >3 FB Neck ROM: full    Dental no notable dental hx.    Pulmonary neg pulmonary ROS, former smoker,    Pulmonary exam normal breath sounds clear to auscultation       Cardiovascular negative cardio ROS Normal cardiovascular exam Rhythm:regular Rate:Normal     Neuro/Psych negative neurological ROS  negative psych ROS   GI/Hepatic negative GI ROS, Neg liver ROS,   Endo/Other  negative endocrine ROSdiabetes  Renal/GU negative Renal ROS  negative genitourinary   Musculoskeletal   Abdominal   Peds  Hematology negative hematology ROS (+)   Anesthesia Other Findings   Reproductive/Obstetrics (+) Pregnancy                             Anesthesia Physical Anesthesia Plan  ASA: II  Anesthesia Plan: Epidural   Post-op Pain Management:    Induction:   PONV Risk Score and Plan:   Airway Management Planned:   Additional Equipment:   Intra-op Plan:   Post-operative Plan:   Informed Consent: I have reviewed the patients History and Physical, chart, labs and discussed the procedure including the risks, benefits and alternatives for the proposed anesthesia with the patient or authorized representative who has indicated his/her understanding and acceptance.     Dental Advisory Given  Plan Discussed with: Anesthesiologist and CRNA  Anesthesia Plan Comments: (Labs checked- platelets confirmed with RN in room. Fetal heart tracing, per RN, reported to be stable enough for sitting procedure. Discussed epidural, and patient consents to the procedure:  included risk of possible headache,backache, failed block, allergic  reaction, and nerve injury. This patient was asked if she had any questions or concerns before the procedure started.)        Anesthesia Quick Evaluation

## 2019-03-30 NOTE — Discharge Summary (Signed)
Postpartum Discharge Summary     Patient Name: Sharon Solis DOB: May 19, 1990 MRN: [id number]  Date of admission: 03/30/2019 Delivering Provider: [name]   Date of discharge: 03/31/2019  Admitting diagnosis: Gestational diabetes mellitus, class A2 [O24.419] Intrauterine pregnancy: [redacted]w[redacted]d    Secondary diagnosis:  Active Problems:   Group B streptococcal carriage in urine complicating pregnancy   Gestational diabetes mellitus (GDM) treated with oral hypoglycemic therapy   Unwanted fertility  Additional problems: None     Discharge diagnosis: Term Pregnancy Delivered                                                                                                Post partum procedures:postpartum tubal ligation  Augmentation: AROM, Pitocin and Cytotec  Complications: None  Hospital course:  Induction of Labor With Vaginal Delivery   28y.o. yo G3P2002 at 347w0das admitted to the hospital 03/30/2019 for induction of labor.  Indication for induction: A2 DM.  Patient had an uncomplicated labor course as follows:  Patient was admitted and induction started with cytotec. She was then started on Pitocin. She was AROMed and then progressed to complete.  Membrane Rupture Time/Date: 9:01 AM ,03/30/2019   Intrapartum Procedures: Episiotomy: None [1]                                         Lacerations:  Periurethral [8]  Patient had delivery of a Viable infant.  Information for the patient's newborn:  MaKallista, Pae0[[id number]]    03/30/2019  Details of delivery can be found in separate delivery note.  Patient had a routine postpartum course. Fasting AM glucose 100. Tubal done on 12/30. She needs a Pap done post-partum. Patient is discharged home 03/31/19. Delivery time: 11:35 AM    Magnesium Sulfate received: No BMZ received: No Rhophylac:N/A MMR:N/A Transfusion:No  Physical exam  Vitals:   03/30/19 1646 03/30/19 2100 03/31/19 0037 03/31/19 0543   BP: 137/76 96/69 106/68 114/73  Pulse: 92 78 78 73  Resp: '18 16 16 16  ' Temp: 98.6 F (37 C) 98.4 F (36.9 C) 98 F (36.7 C) 98 F (36.7 C)  TempSrc: Oral Oral Oral Oral  SpO2: 98% 99% 100% 100%  Weight:      Height:       General: alert, cooperative and no distress Lochia: appropriate Uterine Fundus: firm Incision: Healing well with no significant drainage DVT Evaluation: No evidence of DVT seen on physical exam. Labs: Lab Results  Component Value Date   WBC 12.0 (H) 03/30/2019   HGB 12.0 03/30/2019   HCT 35.9 (L) 03/30/2019   MCV 88.6 03/30/2019   PLT 210 03/30/2019   CMP Latest Ref Rng & Units 10/18/2018  Glucose 65 - 99 mg/dL 102(H)  BUN 6 - 20 mg/dL 7  Creatinine 0.57 - 1.00 mg/dL 0.67  Sodium 134 - 144 mmol/L 138  Potassium 3.5 - 5.2 mmol/L 4.0  Chloride 96 - 106 mmol/L 101  CO2 20 -  29 mmol/L 18(L)  Calcium 8.7 - 10.2 mg/dL 9.6  Total Protein 6.0 - 8.5 g/dL 7.1  Total Bilirubin 0.0 - 1.2 mg/dL <0.2  Alkaline Phos 39 - 117 IU/L 79  AST 0 - 40 IU/L 7  ALT 0 - 32 IU/L 10    Discharge instruction: per After Visit Summary and "Baby and Me Booklet".  After visit meds:  Allergies as of 03/31/2019   No Known Allergies     Medication List    STOP taking these medications   Accu-Chek FastClix Lancets Misc   Accu-Chek Guide test strip Generic drug: glucose blood   Accu-Chek Guide w/Device Kit   aspirin EC 81 MG tablet   metFORMIN 500 MG tablet Commonly known as: GLUCOPHAGE     TAKE these medications   acetaminophen 325 MG tablet Commonly known as: Tylenol Take 2 tablets (650 mg total) by mouth every 6 (six) hours as needed (for pain scale < 4).   ibuprofen 600 MG tablet Commonly known as: ADVIL Take 1 tablet (600 mg total) by mouth every 6 (six) hours.   oxyCODONE 5 MG immediate release tablet Commonly known as: Oxy IR/ROXICODONE Take 1 tablet (5 mg total) by mouth every 4 (four) hours as needed (pain scale 4-7).   prenatal vitamin w/FE, FA  27-1 MG Tabs tablet Take 1 tablet by mouth daily at 12 noon.       Diet: routine diet  Activity: Advance as tolerated. Pelvic rest for 6 weeks.   Outpatient follow up:4 weeks Follow up Appt: Future Appointments  Date Time Provider [place]  05/04/2019  1:15 PM Darlina Rumpf, CNM [facility]   Follow up Visit: Please schedule this patient for Postpartum visit in: 4 weeks with the following provider: Any provider For C/S patients schedule nurse incision check in weeks 2 weeks: no High risk pregnancy complicated by: [email or web address] Delivery mode:  SVD Anticipated Birth Control:  BTL done PP PP Procedures needed: Incision check, postpartum Pap Schedule Integrated BH visit: no   Newborn Data: Live born female  Birth Weight: 3325g APGAR: 28, 9  Newborn Delivery   Birth date/time: 03/30/2019 11:35:00 Delivery type: Vaginal, Spontaneous      Baby Feeding: Breast Disposition:home with mother   03/31/2019 Chauncey Mann, MD

## 2019-03-30 NOTE — Progress Notes (Signed)
Sharon Solis is a 28 y.o. G3P2002 at [redacted]w[redacted]d admitted for induction of labor due to [facility]  Subjective: Comfortable with epidural  Objective: BP 122/75   Pulse 74   Temp 98 F (36.7 C) (Oral)   Resp 18   Ht 5\' 6"  (1.676 m)   Wt 116.7 kg   LMP 06/30/2018 (Approximate)   SpO2 100%   BMI 41.53 kg/m  No intake/output data recorded.  FHT:  FHR: 120 bpm, variability: moderate,  accelerations:  Present,  decelerations:  Absent UC:   regular, every 2-3 minutes  SVE:   Dilation: 4.5 Effacement (%): 70 Station: -2 Exam by:: [employer] RN  Pitocin @ 6 mu/min  Labs: Lab Results  Component Value Date   WBC 12.0 (H) 03/30/2019   HGB 12.0 03/30/2019   HCT 35.9 (L) 03/30/2019   MCV 88.6 03/30/2019   PLT 210 03/30/2019    Assessment / Plan: 28 y.o. G3P2002 [redacted]w[redacted]d here for [place]. #Labor: Progressing well. S/p Cytotec x1 and Pit started at 0534 (currently at 6), continue to titrate as needed. AROM at this check with clear fluid #Pain: Epidural #FWB: Cat I #GBS positive; PCN #GDMA2: q4h latent and q2h active labor CBG's; last glucose 91  Anticipate vaginal delivery  [name] DO OB Fellow, [facility] 03/30/2019, 9:11 AM

## 2019-03-30 NOTE — Lactation Note (Signed)
This note was copied from a baby's chart. Lactation Consultation Note  Patient Name: Sharon Solis OMVEH'M Date: 03/30/2019   Mom asleep with all the lights off when [facility] entered room for initial consult.  Dad was awake in chair.  LC left lactation brochure with dad and let him know about the lactation phone line listed.    LC encouraged dad to have mom use call bell if assistance needed or concerns arise regarding breastfeeding.       Maternal Data    Feeding    LATCH Score                   Interventions    Lactation Tools Discussed/Used     Consult Status      Ferne Coe [facility] 03/30/2019, 9:54 PM

## 2019-03-30 NOTE — Op Note (Signed)
Sharon Solis  03/30/2019  PREOPERATIVE DIAGNOSIS:  Multiparity, undesired fertility  POSTOPERATIVE DIAGNOSIS:  Multiparity, undesired fertility  PROCEDURE:  Postpartum Bilateral Tubal Sterilization using Filshie Clips   ANESTHESIA:  Epidural and local analgesia using 8.78% Marcaine  COMPLICATIONS:  None immediate.  ESTIMATED BLOOD LOSS: 5 ml.  INDICATIONS: 28 y.o. [id number]  with undesired fertility,status post vaginal delivery, desires permanent sterilization.  Other reversible forms of contraception were discussed with patient; she declines all other modalities. Risks of procedure discussed with patient including but not limited to: risk of regret, permanence of method, bleeding, infection, injury to surrounding organs and need for additional procedures.  Failure risk of 0.5-1% with increased risk of ectopic gestation if pregnancy occurs was also discussed with patient.     FINDINGS:  Normal uterus, tubes, and ovaries.  PROCEDURE DETAILS: The patient was taken to the operating room where her spinal anesthesia was dosed up to surgical level and found to be adequate.  She was then placed in a supine position and prepped and draped in the usual sterile fashion.  After an adequate timeout was performed, attention was turned to the patient's abdomen where a small transverse skin incision was made under the umbilical fold. The incision was taken down to the layer of fascia using the scalpel and mayo scissors, and fascia was incised, and extended bilaterally. The peritoneum was entered in a sharp fashion. The patient was placed in Trendelenburg.  A moist lap pad was used to move omentum and bowel away until the left fallopian tube was identified and grasped with a Babcock clamp, and followed out to the fimbriated end.  A Filshie clip was placed on the left fallopian tube about 2 cm from the cornu.  A similar process was carried out on the right side allowing for bilateral tubal sterilization.   Good hemostasis was noted overall.The instruments were then removed from the patient's abdomen and the fascial incision was repaired with 0 Vicryl, and the skin was closed with a 4-0 Vicryl subcuticular stitch. The patient tolerated the procedure well.  Sponge, lap, and needle counts were correct times two.  The patient was then taken to the recovery room awake, extubated and in stable condition.  [name] MD 03/30/2019 1:59 PM

## 2019-03-30 NOTE — H&P (Signed)
OBSTETRIC ADMISSION HISTORY AND PHYSICAL  Sharon Solis is a 28 y.o. female G3P2002 with IUP at 55w0dby LMP [place] She reports +FMs, No LOF, no VB, no blurry vision, headaches or peripheral edema, and RUQ pain.  She plans on breast feeding. She requests BTL for birth control. [facility]  Dating: By LMP --->  Estimated Date of Delivery: 04/06/19  Sono:  12/10  '@[redacted]w[redacted]d' , CWD, normal anatomy, cephalic presentation, anterior placental lie, 2934g, 60% EFW  Prenatal History/Complications: GDMA2 GBSuria LGSIL on Pap 10/18/2018  Past Medical History: Past Medical History:  Diagnosis Date  . Anemia    H/O  . Depression   . Family history of adverse reaction to anesthesia    PTS MOM IS HARD TO WAKE UP  . Gestational diabetes   . Headache    H/O    Past Surgical History: Past Surgical History:  Procedure Laterality Date  . CHOLECYSTECTOMY N/A 10/16/2015   Procedure: LAPAROSCOPIC CHOLECYSTECTOMY ;  Surgeon: JLeonie Green MD;  Location: [facility] ORS;  Service: General;  Laterality: N/A;    Obstetrical History: OB History    Gravida  3   Para  2   Term  2   Preterm      AB      Living  2     SAB      TAB      Ectopic      Multiple      Live Births  2           Social History: Social History   Socioeconomic History  . Marital status: Single    Spouse name: Not on file  . Number of children: Not on file  . Years of education: Not on file  . Highest education level: Not on file  Occupational History  . Not on file  Tobacco Use  . Smoking status: Former SResearch scientist (life sciences) . Smokeless tobacco: Never Used  Substance and Sexual Activity  . Alcohol use: No  . Drug use: Not Currently    Types: Marijuana    Comment: None for several months now  . Sexual activity: Yes    Birth control/protection: None  Other Topics Concern  . Not on file  Social History Narrative  . Not on file   Social Determinants of Health    Financial Resource Strain:   . Difficulty of Paying Living Expenses: Not on file  Food Insecurity:   . Worried About RCharity fundraiserin the Last Year: Not on file  . Ran Out of Food in the Last Year: Not on file  Transportation Needs:   . Lack of Transportation (Medical): Not on file  . Lack of Transportation (Non-Medical): Not on file  Physical Activity:   . Days of Exercise per Week: Not on file  . Minutes of Exercise per Session: Not on file  Stress:   . Feeling of Stress : Not on file  Social Connections:   . Frequency of Communication with Friends and Family: Not on file  . Frequency of Social Gatherings with Friends and Family: Not on file  . Attends Religious Services: Not on file  . Active Member of Clubs or Organizations: Not on file  . Attends CArchivistMeetings: Not on file  . Marital Status: Not on file    Family History: Family History  Problem Relation Age of Onset  . Hypothyroidism Mother   . Cancer  Father     Allergies: No Known Allergies  Medications Prior to Admission  Medication Sig Dispense Refill Last Dose  . Accu-Chek FastClix Lancets MISC 1 Units by Percutaneous route 4 (four) times daily. 100 each 12   . aspirin EC 81 MG tablet Take 1 tablet (81 mg total) by mouth daily. Take after 12 weeks for prevention of preeclampsia later in pregnancy 300 tablet 2   . Blood Glucose Monitoring Suppl (ACCU-CHEK GUIDE) w/Device KIT 1 Device by Does not apply route 4 (four) times daily. 1 kit 0   . glucose blood (ACCU-CHEK GUIDE) test strip Use to check blood sugars four times a day was instructed 50 each 12   . metFORMIN (GLUCOPHAGE) 500 MG tablet Take 1 tablet (500 mg total) by mouth 2 (two) times daily with a meal. 60 tablet 3   . prenatal vitamin w/FE, FA (PRENATAL 1 + 1) 27-1 MG TABS tablet Take 1 tablet by mouth daily at 12 noon.        Review of Systems   All systems reviewed and negative except as stated in HPI  Height '5\' 6"'  (1.676 m),  weight 116.7 kg, last menstrual period 06/30/2018. General appearance: alert, cooperative, appears stated age and morbidly obese Lungs: normal effort Heart: regular rate  Abdomen: soft, non-tender; bowel sounds normal Pelvic: gravid uterus Extremities: Homans sign is negative, no sign of DVT Presentation: cephalic by RN exam Fetal monitoringBaseline: 135 bpm, Variability: Good {> 6 bpm), Accelerations: Reactive and Decelerations: Absent Uterine activityNone Dilation: 3 Effacement (%): 50 Station: -2 Exam by:: [employer] RN   Prenatal labs: ABO, Rh: --/--/PENDING (12/30 7867) Antibody: PENDING (12/30 0048) Rubella: 2.61 (07/20 1648) RPR: Non Reactive (10/28 0854)  HBsAg: Negative (07/20 1648)  HIV: Non Reactive (10/28 0854)  GBS:  uria 2 hr Glucola positive Genetic screening  Low risk female Anatomy US normal  Prenatal Transfer Tool  Maternal Diabetes: Yes:  Diabetes Type:  Insulin/Medication controlled Genetic Screening: Normal Maternal Ultrasounds/Referrals: Normal Fetal Ultrasounds or other Referrals:  None Maternal Substance Abuse:  No Significant Maternal Medications:  None Significant Maternal Lab Results: Group B Strep positive  Results for orders placed or performed during the hospital encounter of 03/30/19 (from the past 24 hour(s))  Type and screen   Collection Time: 03/30/19 12:48 AM  Result Value Ref Range   ABO/RH(D) PENDING    Antibody Screen PENDING    Sample Expiration      04/02/2019,2359 Performed at [facility], [place] [address], [place], [place] [address]     Patient Active Problem List   Diagnosis Date Noted  . Gestational diabetes mellitus (GDM) treated with oral hypoglycemic therapy 01/27/2019  . Abnormal findings on prenatal screening 11/09/2018  . Group B streptococcal carriage in urine complicating pregnancy 47/11/6281  . LGSIL on Pap smear of cervix on 10/18/18 10/21/2018  . Supervision of high risk pregnancy, antepartum 10/18/2018     Assessment/Plan:  Sharon Solis is a 28 y.o. G3P2002 at 29w0dhere for IOL [place]  #Labor: Vertex by RN exam. Due to cervical thickness will induce with Cytotec then anticipate Pitocin PRN thereafter. Anticipate SVD. #Pain: Per patient request #FWB: Cat I; EFW: 3800g; proven to 7lbs 11oz #ID:  GBS positive from urine; PCN ordered  #MOF: Breast  #MOC: BTL; papers signed 10/28 #GDMA2: On Metformin. Will monitor q4h latent and q2h active labor.  CBarrington Ellison MD [facility] Hospital[facility] Medicine Fellow, FNj [facility] [employer] [facility]  03/30/2019, 1:06 AM

## 2019-03-30 NOTE — Anesthesia Preprocedure Evaluation (Signed)
Anesthesia Evaluation  Patient identified by MRN, date of birth, ID band Patient awake    Reviewed: Allergy & Precautions, H&P , NPO status , Patient's Chart, lab work & pertinent test results, reviewed documented beta blocker date and time   Airway Mallampati: II  TM Distance: >3 FB Neck ROM: full    Dental no notable dental hx.    Pulmonary neg pulmonary ROS, former smoker,    Pulmonary exam normal breath sounds clear to auscultation       Cardiovascular negative cardio ROS Normal cardiovascular exam Rhythm:regular Rate:Normal     Neuro/Psych negative neurological ROS  negative psych ROS   GI/Hepatic negative GI ROS, Neg liver ROS,   Endo/Other  negative endocrine ROSdiabetes  Renal/GU negative Renal ROS  negative genitourinary   Musculoskeletal   Abdominal   Peds  Hematology negative hematology ROS (+)   Anesthesia Other Findings   Reproductive/Obstetrics (+) Pregnancy                             Anesthesia Physical  Anesthesia Plan  ASA: II  Anesthesia Plan: Epidural   Post-op Pain Management:    Induction:   PONV Risk Score and Plan:   Airway Management Planned:   Additional Equipment:   Intra-op Plan:   Post-operative Plan:   Informed Consent: I have reviewed the patients History and Physical, chart, labs and discussed the procedure including the risks, benefits and alternatives for the proposed anesthesia with the patient or authorized representative who has indicated his/her understanding and acceptance.     Dental Advisory Given  Plan Discussed with: Anesthesiologist and CRNA  Anesthesia Plan Comments:         Anesthesia Quick Evaluation

## 2019-03-31 DIAGNOSIS — Z3009 Encounter for other general counseling and advice on contraception: Secondary | ICD-10-CM

## 2019-03-31 LAB — GLUCOSE, CAPILLARY: Glucose-Capillary: 100 mg/dL — ABNORMAL HIGH (ref 70–99)

## 2019-03-31 MED ORDER — ACETAMINOPHEN 325 MG PO TABS: 650.0000 mg | ORAL_TABLET | Freq: Four times a day (QID) | ORAL | 0 refills | Status: DC | PRN

## 2019-03-31 MED ORDER — IBUPROFEN 600 MG PO TABS: 600.0000 mg | ORAL_TABLET | Freq: Four times a day (QID) | ORAL | 0 refills | Status: DC

## 2019-03-31 MED ORDER — OXYCODONE HCL 5 MG PO TABS: 5.0000 mg | ORAL_TABLET | ORAL | 0 refills | Status: DC | PRN

## 2019-03-31 NOTE — Lactation Note (Signed)
This note was copied from a baby's chart. Lactation Consultation Note  Patient Name: Sharon Solis HRCBU'L Date: 03/31/2019 Reason for consult: Initial assessment;Term;Infant weight loss;Other (Comment)(per mom missed the 1st [place] visit due to having a BTL)  Baby is 73 hours old  LC reviewed the doc flow sheets and updated per mom.  It had been 4 hours since the baby last attempted to latch.  LC recommended checking the diaper and offered to change the wet and stool.  Baby STS to latch on the right breast / football and baby fed for 10 mins with swallows increasing with breast compressions.  Nipple well rounded when baby released.  LC reviewed the importance of STS feedings.  Sore nipple and engorgement prevention and tx reviewed.  Per mom has hand pump and a DEBP at home ( [place] ).  Mom has the [facility] pamphlet with [facility] phone numbers.    Maternal Data Has patient been taught Hand Expression?: No(not at this consult per mom feels comfortable exp) Does the patient have breastfeeding experience prior to this delivery?: Yes  Feeding Feeding Type: Breast Fed  LATCH Score Latch: Grasps breast easily, tongue down, lips flanged, rhythmical sucking.  Audible Swallowing: Spontaneous and intermittent  Type of Nipple: Everted at rest and after stimulation  Comfort (Breast/Nipple): Soft / non-tender  Hold (Positioning): Assistance needed to correctly position infant at breast and maintain latch.  LATCH Score: 9  Interventions Interventions: Breast feeding basics reviewed;Assisted with latch;Skin to skin;Breast massage;Breast compression;Adjust position;Support pillows;Position options;Shells  Lactation Tools Discussed/Used Tools: Shells Shell Type: Inverted WIC Program: No Pump Review: Milk Storage   Consult Status Consult Status: Follow-up Date: 04/01/19 Follow-up type: In-patient    [place] 03/31/2019, 10:40 AM

## 2019-03-31 NOTE — Progress Notes (Signed)
CSW acknowledges consult and completed clinical assessment.  Clinical documentation will follow.  There are no barriers to discharge.  Elijio Miles, LCSW [facility] [employer] [phone]

## 2019-03-31 NOTE — Clinical Social Work Maternal (Signed)
CLINICAL SOCIAL WORK MATERNAL/CHILD NOTE  Patient Details  Name: Sharon Solis MRN: [id number] Date of Birth: 1990-10-09  Date:  03/31/2019  Clinical Social Worker Initiating Note:  Elijio Miles Date/Time: Initiated:  03/31/19/1306     Child's Name:  Sharon Solis   Biological Parents:  Mother, Father(Sharon Solis and Sharon Solis DOB: 03/21/1990)   Need for Interpreter:  None   Reason for Referral:  Behavioral Health Concerns, Current Substance Use/Substance Use During Pregnancy     Address:  [address] [place] [address]    Phone number:  [phone] (home)     Additional phone number:   Household Members/Support Persons (HM/SP):   Household Member/Support Person 1, Household Member/Support Person 2, Household Member/Support Person 3   HM/SP Name Relationship DOB or Age  HM/SP -1 Sharon Solis FOB 03/21/1990  HM/SP -2 Sharon Solis Son 12/04/2010  HM/SP -3 Sharon Solis Daughter 12/06/2012  HM/SP -4        HM/SP -5        HM/SP -6        HM/SP -7        HM/SP -8          Natural Supports (not living in the home):  Parent, Immediate Family   Professional Supports: None   Employment: Full-time   Type of Work: Investment banker, operational   Education:  [place] arranged:    Museum/gallery curator Resources:  Multimedia programmer    Other Resources:  (Intends to apply for [facility])   Cultural/Religious Considerations Which May Impact Care:    Strengths:  Ability to meet basic needs  , Home prepared for child  , Pediatrician chosen   Psychotropic Medications:         Pediatrician:    Solicitor area  Lexicographer List:   Financial planner)  Meta      Pediatrician Fax Number:    Risk Factors/Current Problems:  Substance Use  , Mental Health Concerns     Cognitive State:      Mood/Affect:  Calm  , Comfortable  , Bright  , Happy  ,  Relaxed  , Interested     CSW Assessment:  CSW received consult for Edinburgh Score of 10 and history of PPD. During chart review, it was also noted that MOB had THC use during pregnancy. CSW met with MOB to offer support and complete assessment.    MOB resting in bed tending to infant with FOB present at bedside, when CSW entered the room. CSW introduced self and received verbal permission from MOB to request that FOB step out so that CSW could meet with MOB alone. FOB understanding and left voluntarily. CSW explained reason for consult to which MOB expressed understanding. MOB very pleasant and engaged throughout assessment and was appropriate and attentive to infant. MOB stated she and FOB currently live together with MOB's 2 other children. MOB reported she works full-time for [employer] in [employer]. MOB stated she does not currently receive WIC but intends to apply. 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 MOB shared that much of the PPD she felt with her children was exasperated by the relationship with their father. MOB reported that this time feels better and that  FOB is a great support for her. MOB shared she intends to start therapy at [facility] and may consider medication management. CSW provided education regarding the baby blues period vs. perinatal mood disorders. CSW recommended self-evaluation during the postpartum time period using the New Mom Checklist from Postpartum Progress and encouraged MOB to contact a medical professional if symptoms are noted at any time. MOB did not appear to be displaying any acute mental health symptoms and denied any current SI, HI or DV. MOB reported feeling well-supported by her mother and her sister. MOB confirmed having all essential items for infant once discharged and stated infant would be sleeping in a bassinet once home. CSW  provided Sudden Infant Death (SIDS) precautions and safe sleeping habits.  CSW inquired about MOB's substance use during pregnancy and MOB stated she used marijuana to help with her anxiety and help her sleep. MOB stated frequency of use was every other day and that her last use was a few weeks ago. CSW informed MOB of Hospital Drug Policy and explained UDS unable to be collected but that CDS was pending and a CPS report would be made, if warranted. MOB understanding of this and denied any questions or concerns. MOB shared previous CPS involvement due to her children's father. MOB shared most recent case was in June or July of 2019 and that case has since been closed. CSW will continue to monitor CDS and make report if necessary.   CSW Plan/Description:  No Further Intervention Required/No Barriers to Discharge, Sudden Infant Death Syndrome (SIDS) Education, Perinatal Mood and Anxiety Disorder (PMADs) Education, [place], CSW Will Continue to Monitor Umbilical Cord Tissue Drug Screen Results and Make Report if Foye Spurling, LCSW 03/31/2019, 3:52 PM

## 2019-04-26 DIAGNOSIS — F330 Major depressive disorder, recurrent, mild: Secondary | ICD-10-CM | POA: Diagnosis not present

## 2019-05-04 ENCOUNTER — Ambulatory Visit: Payer: No Typology Code available for payment source | Admitting: Advanced Practice Midwife

## 2019-05-10 ENCOUNTER — Telehealth: Payer: Self-pay | Admitting: Radiology

## 2019-05-10 NOTE — Telephone Encounter (Signed)
Left message for patient to call [facility] to reschedule missed appointment on 05/04/19 for postpartum and GTT

## 2019-05-20 ENCOUNTER — Ambulatory Visit (INDEPENDENT_AMBULATORY_CARE_PROVIDER_SITE_OTHER)
Admission: RE | Admit: 2019-05-20 | Discharge: 2019-05-20 | Disposition: A | Discharge status: Home / Self Care | Payer: Commercial Managed Care - PPO | Source: Ambulatory Visit

## 2019-05-20 DIAGNOSIS — R1030 Lower abdominal pain, unspecified: Secondary | ICD-10-CM

## 2019-05-20 DIAGNOSIS — R300 Dysuria: Secondary | ICD-10-CM | POA: Diagnosis not present

## 2019-05-20 MED ORDER — CEPHALEXIN 500 MG PO CAPS: 500.0000 mg | ORAL_CAPSULE | Freq: Three times a day (TID) | ORAL | 0 refills | Status: AC

## 2019-05-20 NOTE — ED Provider Notes (Signed)
Virtual Visit via Video Note:  Sharon Solis  initiated request for Telemedicine visit with [facility] team. I connected with Sharon Solis  on 05/20/2019 at 10:08 AM  for a synchronized telemedicine visit using a video enabled HIPPA compliant telemedicine application. I verified that I am speaking with Sharon Solis  using two identifiers. Sharon Balloon, NP  was physically located in a [facility] Urgent care site and Sharon Solis was located at a different location.   The limitations of evaluation and management by telemedicine as well as the availability of in-person appointments were discussed. Patient was informed that she  may incur a bill ( including co-pay) for this virtual visit encounter. Sharon Solis  expressed understanding and gave verbal consent to proceed with virtual visit.     History of Present Illness:Sharon Solis  is a 29 y.o. female presents for evaluation of 1 week history of dysuria and urinary urgency.  She reports she now has 2 day history of chills, suprapubic tenderness, bilateral flank pain.  She denies fever, vomiting, diarrhea, or other symptoms.  Denies current pregnancy or breastfeeding.  Attempted treatment at home with increased water intake, Tylenol, and ibuprofen.     No Known Allergies   Past Medical History:  Diagnosis Date  . Anemia    H/O  . Depression   . Family history of adverse reaction to anesthesia    PTS MOM IS HARD TO WAKE UP  . Gestational diabetes   . Headache    H/O     Social History   Tobacco Use  . Smoking status: Former Research scientist (life sciences)  . Smokeless tobacco: Never Used  Substance Use Topics  . Alcohol use: No  . Drug use: Not Currently    Types: Marijuana    Comment: None for several months now    ROS: as stated in HPI.  All other systems reviewed and negative.      Observations/Objective: Physical Exam  VITALS: Patient denies fever. GENERAL: Alert, appears well and in no acute  distress. HEENT: Atraumatic. NECK: Normal movements of the head and neck. CARDIOPULMONARY: No increased WOB. Speaking in clear sentences. I:E ratio WNL.  MS: Moves all visible extremities without noticeable abnormality. PSYCH: Pleasant and cooperative, well-groomed. Speech normal rate and rhythm. Affect is appropriate. Insight and judgement are appropriate. Attention is focused, linear, and appropriate.  NEURO: CN grossly intact. Oriented as arrived to appointment on time with no prompting. Moves both UE equally.  SKIN: No obvious lesions, wounds, erythema, or cyanosis noted on face or hands.   Assessment and Plan:    ICD-10-CM   1. Dysuria  R30.0   2. Lower abdominal pain  R10.30        Follow Up Instructions: Treating with Keflex.  Instructed patient to come here to be seen in person or follow-up with her PCP if her symptoms are not improving in 2 to 3 days.  Instructed her to go to the ED or come here sooner if her symptoms are not improving or get worse.  Patient agrees to plan of care.      I discussed the assessment and treatment plan with the patient. The patient was provided an opportunity to ask questions and all were answered. The patient agreed with the plan and demonstrated an understanding of the instructions.   The patient was advised to call back or seek an in-person evaluation if the symptoms worsen or if the condition fails to improve  as anticipated.      Mickie Bail, NP  05/20/2019 10:08 AM         Mickie Bail, NP 05/20/19 1013

## 2019-05-20 NOTE — Discharge Instructions (Addendum)
Take the antibiotic as directed.  Keep yourself hydrated with at least 8-10 glasses (8 oz) of water daily.    Come here to be seen in person or follow up with your primary care provider if your symptoms are not improving in 2-3 days.    Go to the emergency department of come in to be seen sooner if your symptoms are not improving or get worse.

## 2019-05-31 DIAGNOSIS — F330 Major depressive disorder, recurrent, mild: Secondary | ICD-10-CM | POA: Diagnosis not present

## 2020-02-17 ENCOUNTER — Telehealth: Payer: Commercial Managed Care - PPO | Admitting: Emergency Medicine

## 2020-02-17 DIAGNOSIS — R059 Cough, unspecified: Secondary | ICD-10-CM

## 2020-02-17 DIAGNOSIS — R0602 Shortness of breath: Secondary | ICD-10-CM

## 2020-02-17 DIAGNOSIS — R509 Fever, unspecified: Secondary | ICD-10-CM

## 2020-02-17 NOTE — Progress Notes (Signed)
Based on what you shared with me, I feel your condition warrants further evaluation and I recommend that you be seen for a face to face office visit.  I haven't personally diagnosed COVID in someone who has had been vaccinated and boosted against COVID, but your symptoms of high fever and occasional shortness of breath worry me.    I think that it would be best for you to be evaluated in-person. You may need to have an x-ray of your lungs to see if you have pneumonia and you can be tested for both influenza and COVID.   NOTE: If you entered your credit card information for this eVisit, you will not be charged. You may see a "hold" on your card for the $35 but that hold will drop off and you will not have a charge processed.   If you are having a true medical emergency please call 911.      For an urgent face to face visit, [place] has five urgent care centers for your convenience:     [facility] at [facility] Directions [phone] [address] [place], [place] [address] . 10 am - 6pm Monday - Friday    [facility]) Get Driving Directions [phone] [address] [place], [place] [address] . 10 am to 8 pm Monday-Friday . 12 pm to 8 pm [facility] Suite Urgent Care at [facility] Get Driving Directions [phone] [address] [place], [place] [address] . 8 am to 8 pm Monday-Friday . 9 am to 6 pm Saturday . 11 am to 6 pm Sunday     [facility] at [facility] Get Driving Directions  [phone] [address] [place], [place] [address] . 8 am to 8 pm Monday-Friday . 8 am to 4 pm [facility] Urgent Care at [facility] Directions [phone] [address] [place], [place] [address] . 12 pm to 6 pm Monday-Friday      Your e-visit answers were reviewed by a board certified advanced  clinical practitioner to complete your personal care plan.  Thank you for using e-Visits.    Approximately 5 minutes was used in reviewing the patient's chart, questionnaire, prescribing medications, and documentation.

## 2020-07-04 ENCOUNTER — Encounter (HOSPITAL_COMMUNITY): Payer: Self-pay | Admitting: Student

## 2020-07-04 ENCOUNTER — Other Ambulatory Visit: Payer: Self-pay

## 2020-07-04 ENCOUNTER — Emergency Department (HOSPITAL_COMMUNITY): Payer: Medicaid Other

## 2020-07-04 ENCOUNTER — Emergency Department (HOSPITAL_COMMUNITY)
Admission: EM | Admit: 2020-07-04 | Discharge: 2020-07-04 | Disposition: A | Discharge status: Home / Self Care | Payer: Medicaid Other | Attending: Emergency Medicine | Admitting: Emergency Medicine

## 2020-07-04 DIAGNOSIS — R55 Syncope and collapse: Secondary | ICD-10-CM | POA: Diagnosis not present

## 2020-07-04 DIAGNOSIS — Z87891 Personal history of nicotine dependence: Secondary | ICD-10-CM | POA: Diagnosis not present

## 2020-07-04 DIAGNOSIS — N83202 Unspecified ovarian cyst, left side: Secondary | ICD-10-CM | POA: Insufficient documentation

## 2020-07-04 DIAGNOSIS — R1032 Left lower quadrant pain: Secondary | ICD-10-CM | POA: Diagnosis not present

## 2020-07-04 LAB — BASIC METABOLIC PANEL
Anion gap: 7 (ref 5–15)
BUN: 12 mg/dL (ref 6–20)
CO2: 25 mmol/L (ref 22–32)
Calcium: 9.4 mg/dL (ref 8.9–10.3)
Chloride: 106 mmol/L (ref 98–111)
Creatinine, Ser: 0.66 mg/dL (ref 0.44–1.00)
GFR, Estimated: >60 mL/min (ref >60)
Glucose, Bld: 114 mg/dL — ABNORMAL HIGH (ref 70–99)
Potassium: 4 mmol/L (ref 3.5–5.1)
Sodium: 138 mmol/L (ref 135–145)

## 2020-07-04 LAB — CBC WITH DIFFERENTIAL/PLATELET
Abs Immature Granulocytes: 0.03 10*3/uL (ref 0.00–0.07)
Basophils Absolute: 0.1 10*3/uL (ref 0.0–0.1)
Basophils Relative: 1 %
Eosinophils Absolute: 0.1 10*3/uL (ref 0.0–0.5)
Eosinophils Relative: 1 %
HCT: 43.6 % (ref 36.0–46.0)
Hemoglobin: 14.5 g/dL (ref 12.0–15.0)
Immature Granulocytes: 0 %
Lymphocytes Relative: 16 %
Lymphs Abs: 1.5 10*3/uL (ref 0.7–4.0)
MCH: 29.4 pg (ref 26.0–34.0)
MCHC: 33.3 g/dL (ref 30.0–36.0)
MCV: 88.3 fL (ref 80.0–100.0)
Monocytes Absolute: 0.5 10*3/uL (ref 0.1–1.0)
Monocytes Relative: 5 %
Neutro Abs: 6.9 10*3/uL (ref 1.7–7.7)
Neutrophils Relative %: 77 %
Platelets: 272 10*3/uL (ref 150–400)
RBC: 4.94 MIL/uL (ref 3.87–5.11)
RDW: 13.2 % (ref 11.5–15.5)
WBC: 9 10*3/uL (ref 4.0–10.5)
nRBC: 0 % (ref 0.0–0.2)

## 2020-07-04 LAB — I-STAT BETA HCG BLOOD, ED (MC, WL, AP ONLY): I-stat hCG, quantitative: 6.4 m[IU]/mL — ABNORMAL HIGH (ref <5)

## 2020-07-04 LAB — URINALYSIS, ROUTINE W REFLEX MICROSCOPIC
Bilirubin Urine: NEGATIVE
Glucose, UA: NEGATIVE mg/dL
Hgb urine dipstick: NEGATIVE
Ketones, ur: NEGATIVE mg/dL
Leukocytes,Ua: NEGATIVE
Nitrite: NEGATIVE
Protein, ur: NEGATIVE mg/dL
Specific Gravity, Urine: 1.012 (ref 1.005–1.030)
pH: 8 (ref 5.0–8.0)

## 2020-07-04 LAB — PREGNANCY, URINE: Preg Test, Ur: NEGATIVE

## 2020-07-04 NOTE — ED Provider Notes (Signed)
[facility]-EMERGENCY DEPT Provider Note   CSN: [id number] Arrival date & time: 07/04/20  4098     History Chief Complaint  Patient presents with  . Abdominal Pain  . Near Syncope  . Nausea    Sharon Solis is a 30 y.o. female.  HPI   30 year old female with past medical history of ovarian cyst, tubal ligation presents the emergency department with concern for lower abdominal pain.  Patient states when she woke up this morning she had rather sudden left lower quadrant abdominal pain that became crampy lower pelvic pain.  Patient states that is currently improved but still about a 3/10.  She states she is she is had symptoms like this before with an ovarian cyst in the past.  She denies any fever, dysuria, hematuria, vaginal bleeding, vaginal discharge.  She has been having normal bowel movements.  Past Medical History:  Diagnosis Date  . Anemia    H/O  . Depression   . Family history of adverse reaction to anesthesia    PTS MOM IS HARD TO WAKE UP  . Gestational diabetes   . Headache    H/O    Patient Active Problem List   Diagnosis Date Noted  . Unwanted fertility 03/31/2019  . Gestational diabetes mellitus (GDM) treated with oral hypoglycemic therapy 01/27/2019  . Abnormal findings on prenatal screening 11/09/2018  . Group B streptococcal carriage in urine complicating pregnancy 10/24/2018  . LGSIL on Pap smear of cervix on 10/18/18 10/21/2018  . Supervision of high risk pregnancy, antepartum 10/18/2018    Past Surgical History:  Procedure Laterality Date  . CHOLECYSTECTOMY N/A 10/16/2015   Procedure: LAPAROSCOPIC CHOLECYSTECTOMY ;  Surgeon: Nadeen Landau, MD;  Location: [facility] ORS;  Service: General;  Laterality: N/A;  . TUBAL LIGATION Bilateral 03/30/2019   Procedure: POST PARTUM TUBAL LIGATION;  Surgeon: Kathrynn Running, MD;  Location: [facility] ORS;  Service: Gynecology;  Laterality: Bilateral;     OB History    Gravida  3   Para   3   Term  3   Preterm      AB      Living  3     SAB      IAB      Ectopic      Multiple  0   Live Births  3           Family History  Problem Relation Age of Onset  . Hypothyroidism Mother   . Cancer Father     Social History   Tobacco Use  . Smoking status: Former Games developer  . Smokeless tobacco: Never Used  Vaping Use  . Vaping Use: Never used  Substance Use Topics  . Alcohol use: No  . Drug use: Not Currently    Types: Marijuana    Comment: None for several months now    Home Medications Prior to Admission medications   Medication Sig Start Date End Date Taking? Authorizing Provider  acetaminophen (TYLENOL) 325 MG tablet Take 2 tablets (650 mg total) by mouth every 6 (six) hours as needed (for pain scale < 4). 03/31/19   Fair, Hoyle Sauer, MD  ibuprofen (ADVIL) 600 MG tablet Take 1 tablet (600 mg total) by mouth every 6 (six) hours. 03/31/19   Fair, Hoyle Sauer, MD  oxyCODONE (OXY IR/ROXICODONE) 5 MG immediate release tablet Take 1 tablet (5 mg total) by mouth every 4 (four) hours as needed (pain scale 4-7). 03/31/19   Jerilynn Birkenhead  N, MD  prenatal vitamin w/FE, FA (PRENATAL 1 + 1) 27-1 MG TABS tablet Take 1 tablet by mouth daily at 12 noon.    [provider]    Allergies    Patient has no known allergies.  Review of Systems   Review of Systems  Constitutional: Negative for chills and fever.  HENT: Negative for congestion.   Eyes: Negative for visual disturbance.  Respiratory: Negative for shortness of breath.   Cardiovascular: Negative for chest pain.  Gastrointestinal: Negative for abdominal pain, diarrhea and vomiting.  Genitourinary: Positive for pelvic pain. Negative for dysuria, flank pain, frequency, hematuria, vaginal bleeding, vaginal discharge and vaginal pain.  Skin: Negative for rash.  Neurological: Negative for headaches.    Physical Exam Updated Vital Signs BP 116/88 (BP Location: Right Arm)   Pulse 67   Temp 98.3 F  (36.8 C) (Oral)   Resp 18   Ht 5\' 6"  (1.676 m)   Wt 106.6 kg   LMP 06/17/2020   SpO2 99%   BMI 37.93 kg/m   Physical Exam Vitals and nursing note reviewed.  Constitutional:      Appearance: Normal appearance.  HENT:     Head: Normocephalic.     Mouth/Throat:     Mouth: Mucous membranes are moist.  Cardiovascular:     Rate and Rhythm: Normal rate.  Pulmonary:     Effort: Pulmonary effort is normal. No respiratory distress.  Abdominal:     Palpations: Abdomen is soft.     Tenderness: There is no abdominal tenderness. There is no guarding or rebound.  Skin:    General: Skin is warm.  Neurological:     Mental Status: She is alert and oriented to person, place, and time. Mental status is at baseline.  Psychiatric:        Mood and Affect: Mood normal.     ED Results / Procedures / Treatments   Labs (all labs ordered are listed, but only abnormal results are displayed) Labs Reviewed  CBC WITH DIFFERENTIAL/PLATELET  BASIC METABOLIC PANEL  I-STAT BETA HCG BLOOD, ED ([facility], [facility], [facility] ONLY)    EKG None  Radiology No results found.  Procedures Procedures   Medications Ordered in ED Medications - No data to display  ED Course  I have reviewed the triage vital signs and the nursing notes.  Pertinent labs & imaging results that were available during my care of the patient were reviewed by me and considered in my medical decision making (see chart for details).    MDM Rules/Calculators/A&P                          30 year old female presents the emergency department with lower abdominal pain and concern for ovarian cyst/ruptured cyst.  She is status post tubal ligation. No GU complaints. Vitals are stable, abdominal exam is benign.  Plan for lab evaluation and ultrasound  Lab evaluation is reassuring, i-STAT beta-hCG was slightly elevated at 6.4 urine pregnancy test is negative, patient is status post tubal ligation.  Ultrasound shows complex left ovarian cyst with some  free fluid in the pelvis, could possibly be a ruptured cyst.  Without any vaginal discharge or other symptoms I have a low suspicion for infectious etiology.  Plan is for repeat outpatient ultrasound for monitoring of cyst and symptomatic treatment.  Patient will be discharged and treated as an outpatient.  Discharge plan and strict return to ED precautions discussed, patient verbalizes understanding and agreement.  Final Clinical Impression(s) / ED Diagnoses Final diagnoses:  None    Rx / DC Orders ED Discharge Orders    None       Rozelle Logan, DO 07/04/20 1238

## 2020-07-04 NOTE — ED Triage Notes (Addendum)
Patient BIB POV about have increased abdominal pain around 0630-0700.  Patient felt like she was also going to pass out, had some nausea, and felt like she BP might have dropped.  Patient has a hx of ovarian cyst and she is concerned that one might have ruptured.  Patient reports irregular periods at baseline, no bleeding so far today.  Patient reports 6/10 pain that is cramping pressure and feels like her uterus is going to fall out. Patient ambulatory in the room.

## 2020-07-04 NOTE — ED Notes (Signed)
US at bedside

## 2020-07-04 NOTE — Discharge Instructions (Addendum)
You have been seen and discharged from the emergency department.  Your ultrasound confirms a complex left-sided ovarian cyst.  This will require repeat ultrasound for measurement and reevaluation.  Follow-up with your OBGYN doctor for further care. Take home medications as prescribed. If you have any worsening symptoms or further concerns for health please return to an emergency department for further evaluation.

## 2020-07-05 ENCOUNTER — Telehealth: Payer: Self-pay

## 2020-07-05 NOTE — Telephone Encounter (Signed)
Transition Care Management Unsuccessful Follow-up Telephone Call  Date of discharge and from where: 07/04/2020 from [facility]  Attempts:  1st Attempt  Reason for unsuccessful TCM follow-up call:  Left voice message

## 2020-07-06 NOTE — Telephone Encounter (Signed)
Transition Care Management Unsuccessful Follow-up Telephone Call  Date of discharge and from where:  07/04/2020 from [facility]  Attempts:  2nd Attempt  Reason for unsuccessful TCM follow-up call:  Unable to leave message

## 2020-07-09 NOTE — Telephone Encounter (Signed)
Transition Care Management Unsuccessful Follow-up Telephone Call  Date of discharge and from where:  07/04/2020 from [facility]  Attempts:  3rd Attempt  Reason for unsuccessful TCM follow-up call:  Left voice message

## 2021-02-09 ENCOUNTER — Telehealth: Payer: Medicaid Other | Admitting: Nurse Practitioner

## 2021-02-09 DIAGNOSIS — K0889 Other specified disorders of teeth and supporting structures: Secondary | ICD-10-CM

## 2021-02-09 MED ORDER — IBUPROFEN 800 MG PO TABS: 800.0000 mg | ORAL_TABLET | Freq: Four times a day (QID) | ORAL | 0 refills | Status: DC

## 2021-02-09 NOTE — Patient Instructions (Signed)
  Sharon Solis, thank you for joining Claiborne Rigg, NP for today's virtual visit.  While this provider is not your primary care provider (PCP), if your PCP is located in our provider database this encounter information will be shared with them immediately following your visit.  Consent: (Patient) Sharon Solis provided verbal consent for this virtual visit at the beginning of the encounter.  Current Medications:  Current Outpatient Medications:    acetaminophen (TYLENOL) 325 MG tablet, Take 2 tablets (650 mg total) by mouth every 6 (six) hours as needed (for pain scale < 4)., Disp: 30 tablet, Rfl: 0   ibuprofen (ADVIL) 800 MG tablet, Take 1 tablet (800 mg total) by mouth every 6 (six) hours., Disp: 60 tablet, Rfl: 0   oxyCODONE (OXY IR/ROXICODONE) 5 MG immediate release tablet, Take 1 tablet (5 mg total) by mouth every 4 (four) hours as needed (pain scale 4-7)., Disp: 10 tablet, Rfl: 0   prenatal vitamin w/FE, FA (PRENATAL 1 + 1) 27-1 MG TABS tablet, Take 1 tablet by mouth daily at 12 noon., Disp: , Rfl:    Medications ordered in this encounter:  Meds ordered this encounter  Medications   ibuprofen (ADVIL) 800 MG tablet    Sig: Take 1 tablet (800 mg total) by mouth every 6 (six) hours.    Dispense:  60 tablet    Refill:  0    Order Specific Question:   Supervising Provider    Answer:   Hyacinth Meeker, BRIAN [[id number]]     *If you need refills on other medications prior to your next appointment, please contact your pharmacy*  Follow-Up: Call back or seek an in-person evaluation if the symptoms worsen or if the condition fails to improve as anticipated.  Other Instructions Apply cold compresses to jaw for pain and swelling   If you have been instructed to have an in-person evaluation today at a local Urgent Care facility, please use the link below. It will take you to a list of all of our available [facility], including address, phone number and hours of operation.  Please do not delay care.  [facility]  If you or a family member do not have a primary care provider, use the link below to schedule a visit and establish care. When you choose a [facility] primary care physician or advanced practice provider, you gain a long-term partner in health. Find a Primary Care Provider  Learn more about [facility]'s in-office and virtual care options: [place] - Get Care Now

## 2021-02-09 NOTE — Progress Notes (Signed)
Virtual Visit Consent   Sharon Solis, you are scheduled for a virtual visit with a [facility] provider today.     Just as with appointments in the office, your consent must be obtained to participate.  Your consent will be active for this visit and any virtual visit you may have with one of our providers in the next 365 days.     If you have a MyChart account, a copy of this consent can be sent to you electronically.  All virtual visits are billed to your insurance company just like a traditional visit in the office.    As this is a virtual visit, video technology does not allow for your provider to perform a traditional examination.  This may limit your provider's ability to fully assess your condition.  If your provider identifies any concerns that need to be evaluated in person or the need to arrange testing (such as labs, EKG, etc.), we will make arrangements to do so.     Although advances in technology are sophisticated, we cannot ensure that it will always work on either your end or our end.  If the connection with a video visit is poor, the visit may have to be switched to a telephone visit.  With either a video or telephone visit, we are not always able to ensure that we have a secure connection.     I need to obtain your verbal consent now.   Are you willing to proceed with your visit today?    Sharon Solis has provided verbal consent on 02/09/2021 for a virtual visit (video or telephone).   Claiborne Rigg, NP   Date: 02/09/2021 8:50 AM   Virtual Visit via Video Note   I, Claiborne Rigg, connected with  Sharon Solis  ([id number], [birth year]) on 02/09/21 at  8:45 AM EST by a video-enabled telemedicine application and verified that I am speaking with the correct person using two identifiers.  Location: Patient: Virtual Visit Location Patient: Home Provider: Virtual Visit Location Provider: Home Office   I discussed the limitations of evaluation and management  by telemedicine and the availability of in person appointments. The patient expressed understanding and agreed to proceed.    History of Present Illness: Sharon Solis is a 30 y.o. who identifies as a female who was assigned female at birth, and is being seen today for dental pain.  HPI:  Notes impacted wisdom tooth. Unable to see dentist till next week. Associated symptoms include: pain and swelling. No purulent drainage. She is requesting pain medication today.   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 10/21/2018   Supervision of high risk pregnancy, antepartum 10/18/2018    Allergies: No Known Allergies Medications:  Current Outpatient Medications:    acetaminophen (TYLENOL) 325 MG tablet, Take 2 tablets (650 mg total) by mouth every 6 (six) hours as needed (for pain scale < 4)., Disp: 30 tablet, Rfl: 0   ibuprofen (ADVIL) 800 MG tablet, Take 1 tablet (800 mg total) by mouth every 6 (six) hours., Disp: 60 tablet, Rfl: 0   oxyCODONE (OXY IR/ROXICODONE) 5 MG immediate release tablet, Take 1 tablet (5 mg total) by mouth every 4 (  four) hours as needed (pain scale 4-7)., Disp: 10 tablet, Rfl: 0   prenatal vitamin w/FE, FA (PRENATAL 1 + 1) 27-1 MG TABS tablet, Take 1 tablet by mouth daily at 12 noon., Disp: , Rfl:   Observations/Objective: Patient is well-developed, well-nourished in no acute distress.  Resting comfortably at home.  Head is normocephalic, atraumatic.  No labored breathing.  Speech is clear and coherent with logical content.  Patient is alert and oriented at baseline.    Assessment and Plan: 1. Pain, dental - ibuprofen (ADVIL) 800 MG tablet; Take 1  tablet (800 mg total) by mouth every 6 (six) hours.  Dispense: 60 tablet; Refill: 0 Apply cold compresses to jaw for pain and swelling  Follow Up Instructions: I discussed the assessment and treatment plan with the patient. The patient was provided an opportunity to ask questions and all were answered. The patient agreed with the plan and demonstrated an understanding of the instructions.  A copy of instructions were sent to the patient via MyChart unless otherwise noted below.   The patient was advised to call back or seek an in-person evaluation if the symptoms worsen or if the condition fails to improve as anticipated.  Time:  I spent 10 minutes with the patient via telehealth technology discussing the above problems/concerns.    Claiborne Rigg, NP

## 2021-02-14 IMAGING — US US MFM OB FOLLOW-UP
1 series · 14 of 24 positions shown · non-contrast
Comparison: none

[Series 1: us mfm ob follow-up · 24 acquisitions, 14 frames shown]
[im 1/24]
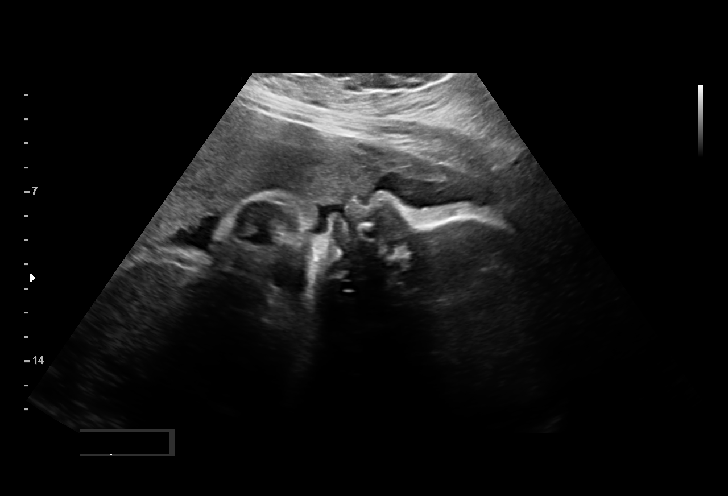
[im 3/24]
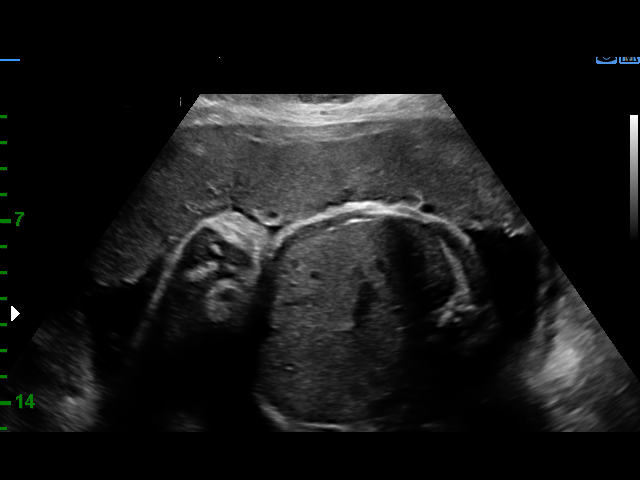
[im 5/24]
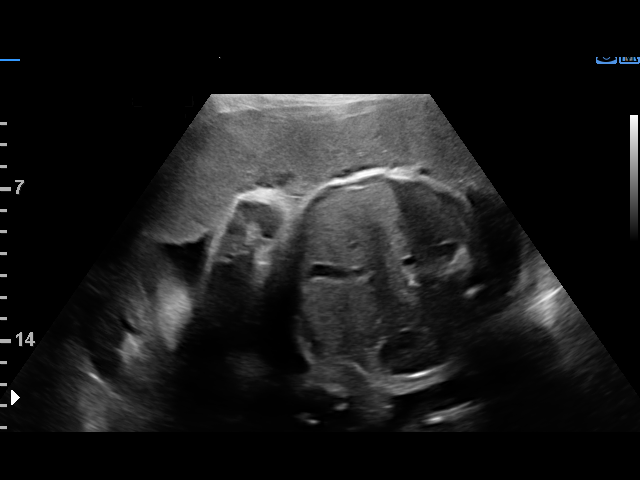
[im 7/24]
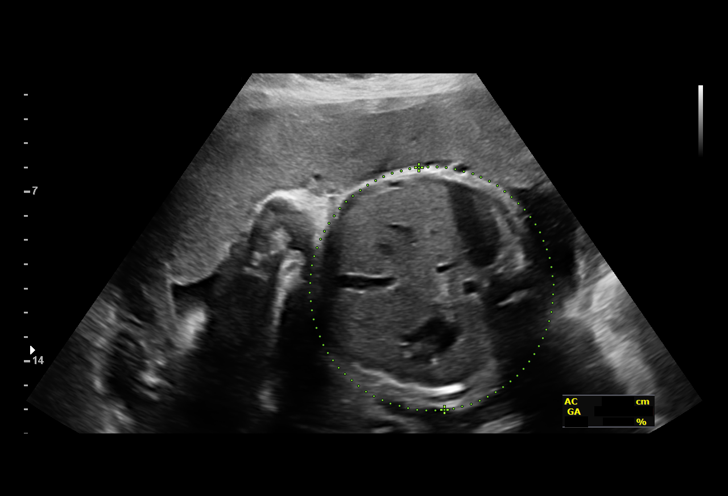
[im 8/24]
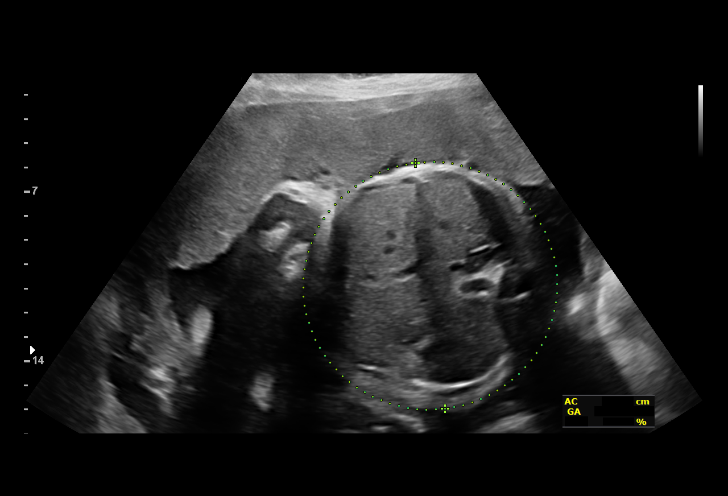
[im 10/24]
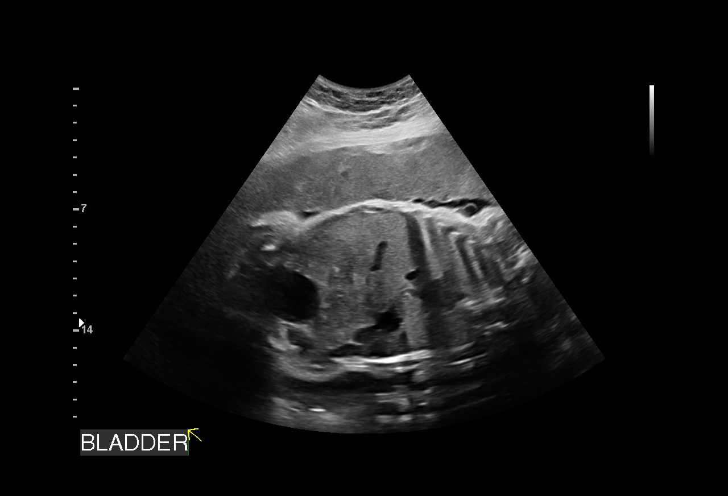
[im 12/24]
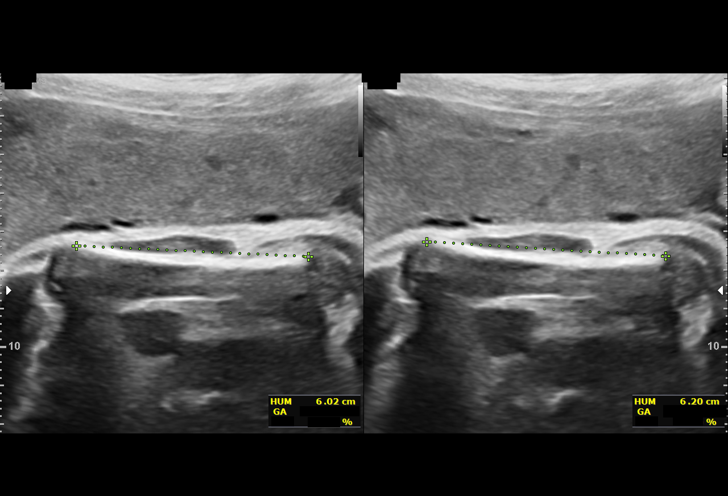
[im 13/24]
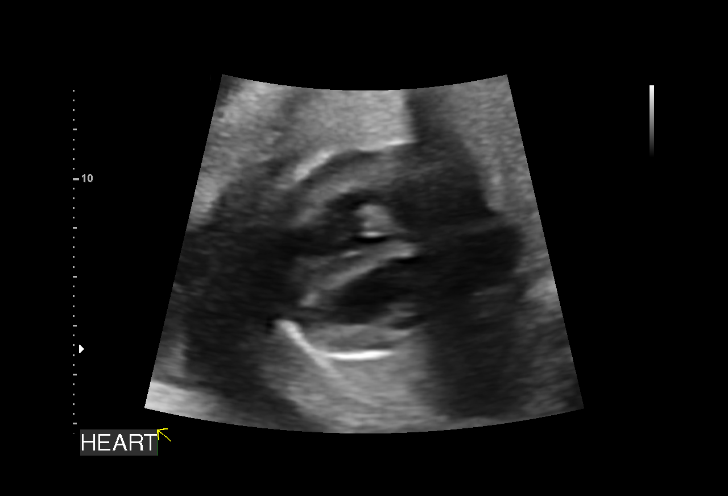
[im 15/24]
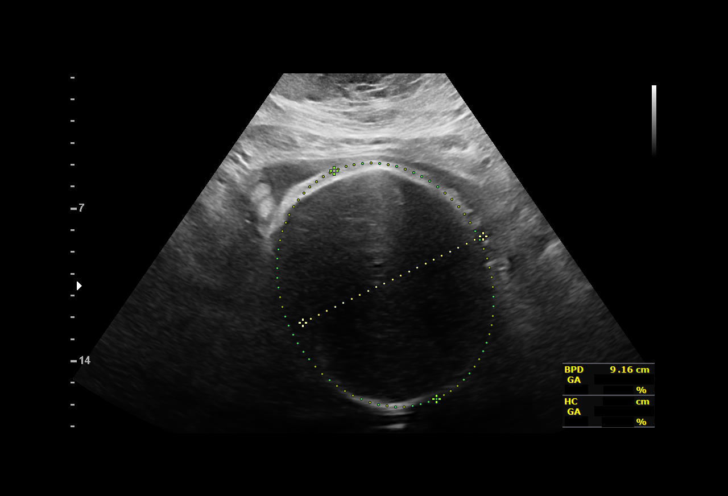
[im 17/24]
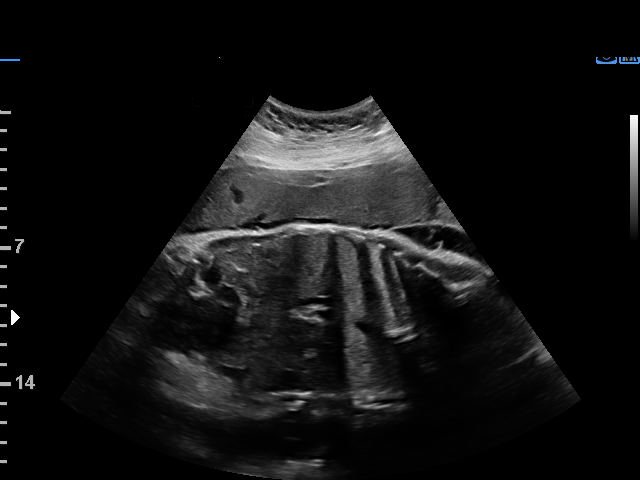
[im 19/24]
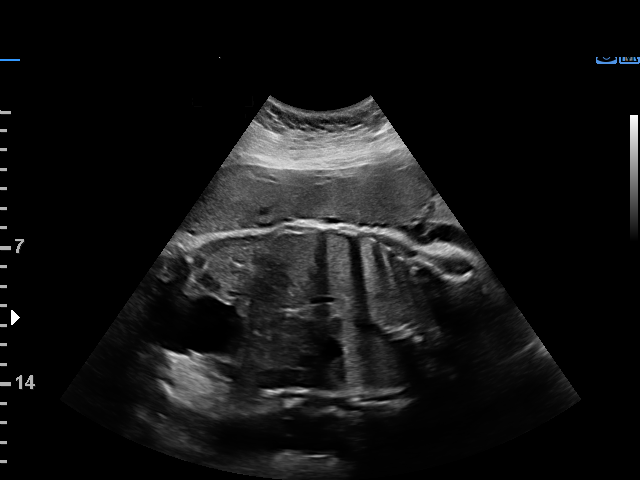
[im 20/24]
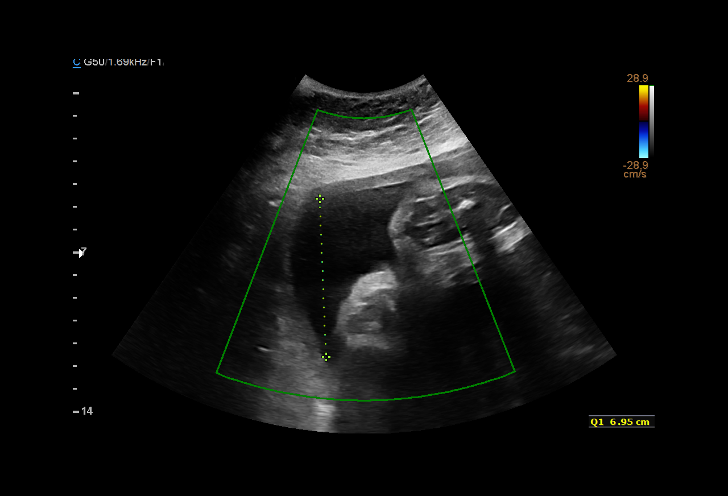
[im 22/24]
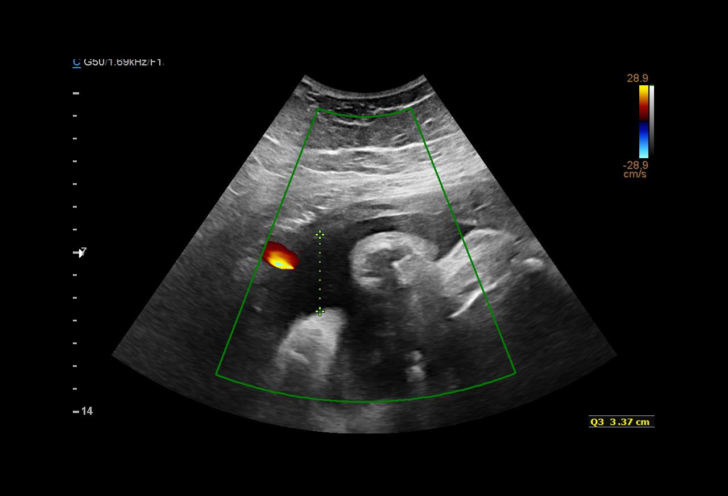
[im 24/24]
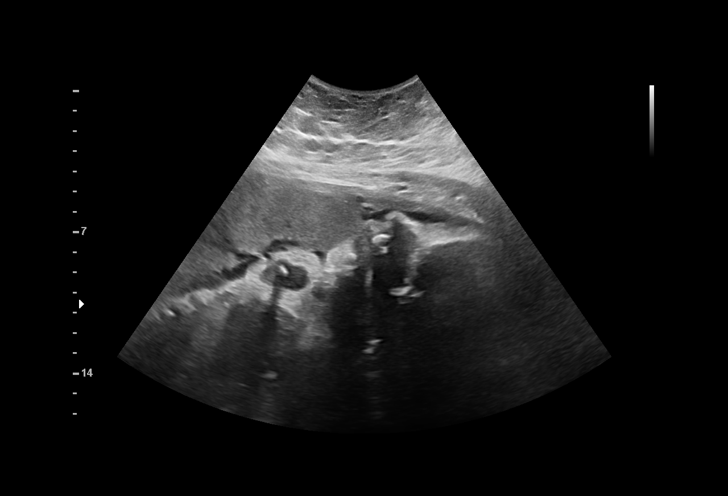

[14 of 24 positions shown; findings below may reference images not displayed]

----------------------------------------------------------------------

 ----------------------------------------------------------------------
Indications

  36 weeks gestation of pregnancy
  Gestational diabetes in pregnancy,
  controlled by oral hypoglycemic drugs
  Encounter for antenatal screening for
  malformations
  Obesity complicating pregnancy, third
  trimester
 ----------------------------------------------------------------------
Vital Signs

                                                Height:        5'6"
Fetal Evaluation

 Num Of Fetuses:         1
 Fetal Heart Rate(bpm):  148
 Cardiac Activity:       Observed
 Presentation:           Cephalic
 Placenta:               Anterior
 P. Cord Insertion:      Visualized, central

 Amniotic Fluid
 AFI FV:      Within normal limits

 AFI Sum(cm)     %Tile       Largest Pocket(cm)
 18.84           71

 RUQ(cm)       RLQ(cm)       LUQ(cm)        LLQ(cm)

Biophysical Evaluation
 Amniotic F.V:   Within normal limits       F. Tone:        Observed
 F. Movement:    Observed                   Score:          [DATE]
 F. Breathing:   Observed
Biometry

 BPD:      92.8  mm     G. Age:  37w 5d         92  %    CI:        72.56   %    70 - 86
                                                         FL/HC:      19.9   %    20.1 -
 HC:      346.5  mm     G. Age:  40w 1d         96  %    HC/AC:      1.08        0.93 -
 AC:      320.4  mm     G. Age:  36w 0d         56  %    FL/BPD:     74.1   %    71 - 87
 FL:       68.8  mm     G. Age:  35w 2d         25  %    FL/AC:      21.5   %    20 - 24
 HUM:      61.1  mm     G. Age:  35w 3d         50  %
 Est. FW:    8856  gm      6 lb 7 oz     60  %
OB History

 Gravidity:    3         Term:   2        Prem:   0        SAB:   0
 TOP:          0       Ectopic:  0        Living: 2
Gestational Age

 LMP:           36w 1d        Date:  06/30/18                 EDD:   04/06/19
 U/S Today:     37w 2d                                        EDD:   03/29/19
 Best:          36w 1d     Det. By:  LMP  (06/30/18)          EDD:   04/06/19
Anatomy

 Cranium:               Appears normal         Aortic Arch:            Previously seen
 Cavum:                 Previously seen        Ductal Arch:            Previously seen
 Ventricles:            Previously seen        Diaphragm:              Previously seen
 Choroid Plexus:        Previously seen        Stomach:                Appears normal, left
                                                                       sided
 Cerebellum:            Previously seen        Abdomen:                Previously seen
 Posterior Fossa:       Previously seen        Abdominal Wall:         Previously seen
 Nuchal Fold:           Previously seen        Cord Vessels:           Previously seen
 Face:                  Orbits and profile     Kidneys:                Appear normal
                        previously seen
 Lips:                  Previously seen        Bladder:                Appears normal
 Thoracic:              Appears normal         Spine:                  Previously seen
 Heart:                 Appears normal         Upper Extremities:      Previously seen
                        (4CH, axis, and
                        situs)
 RVOT:                  Previously seen        Lower Extremities:      Previously seen
 LVOT:                  Previously seen

 Other:  Heels and 5th digits visualized. Nasal bone visualized. Technically
         difficult due to maternal habitus and fetal position.
Cervix Uterus Adnexa

 Cervix
 Not visualized (advanced GA >46wks)

 Uterus
 No abnormality visualized.

 Left Ovary
 No adnexal mass visualized.
 Right Ovary
 No adnexal mass visualized.

 Cul De Sac
 No free fluid seen.

 Adnexa
 No abnormality visualized.
Impression

 Gestational diabetes. Patient takes metformin for control.
 Fetal growth is appropriate for gestational age. Amniotic fluid
 is normal and good fetal activity is seen. Antenatal testing is
 reassuring. BPP [DATE].

 I encouraged her to make a prenatal visit appointment with
 her provider.
Recommendations

 -Continue weekly BPP till delivery.
                 [place], Emir Ceca Fazila

## 2021-04-22 DIAGNOSIS — Z0000 Encounter for general adult medical examination without abnormal findings: Secondary | ICD-10-CM | POA: Diagnosis not present

## 2021-04-22 DIAGNOSIS — F330 Major depressive disorder, recurrent, mild: Secondary | ICD-10-CM | POA: Diagnosis not present

## 2021-04-22 DIAGNOSIS — M2550 Pain in unspecified joint: Secondary | ICD-10-CM | POA: Diagnosis not present

## 2021-04-22 DIAGNOSIS — Z1389 Encounter for screening for other disorder: Secondary | ICD-10-CM | POA: Diagnosis not present

## 2021-04-22 DIAGNOSIS — Z8632 Personal history of gestational diabetes: Secondary | ICD-10-CM | POA: Diagnosis not present

## 2021-04-22 DIAGNOSIS — N83202 Unspecified ovarian cyst, left side: Secondary | ICD-10-CM | POA: Diagnosis not present

## 2021-04-23 ENCOUNTER — Other Ambulatory Visit: Payer: Self-pay | Admitting: Physician Assistant

## 2021-04-23 DIAGNOSIS — N83202 Unspecified ovarian cyst, left side: Secondary | ICD-10-CM

## 2021-04-30 DIAGNOSIS — F419 Anxiety disorder, unspecified: Secondary | ICD-10-CM | POA: Diagnosis not present

## 2021-04-30 DIAGNOSIS — R41840 Attention and concentration deficit: Secondary | ICD-10-CM | POA: Diagnosis not present

## 2021-05-01 ENCOUNTER — Other Ambulatory Visit: Payer: Self-pay

## 2021-05-01 ENCOUNTER — Ambulatory Visit
Admission: RE | Admit: 2021-05-01 | Discharge: 2021-05-01 | Disposition: A | Discharge status: Home / Self Care | Payer: Medicaid Other | Source: Ambulatory Visit | Attending: Physician Assistant | Admitting: Physician Assistant

## 2021-05-01 DIAGNOSIS — N83202 Unspecified ovarian cyst, left side: Secondary | ICD-10-CM | POA: Insufficient documentation

## 2021-05-01 DIAGNOSIS — Z79891 Long term (current) use of opiate analgesic: Secondary | ICD-10-CM | POA: Diagnosis not present

## 2021-05-13 DIAGNOSIS — S0501XA Injury of conjunctiva and corneal abrasion without foreign body, right eye, initial encounter: Secondary | ICD-10-CM | POA: Diagnosis not present

## 2021-05-26 ENCOUNTER — Telehealth: Payer: Medicaid Other | Admitting: Emergency Medicine

## 2021-05-26 DIAGNOSIS — J029 Acute pharyngitis, unspecified: Secondary | ICD-10-CM | POA: Diagnosis not present

## 2021-05-26 MED ORDER — AMOXICILLIN 500 MG PO CAPS: 500.0000 mg | ORAL_CAPSULE | Freq: Two times a day (BID) | ORAL | 0 refills | Status: AC

## 2021-05-26 NOTE — Progress Notes (Signed)
Virtual Visit Consent   [name], you are scheduled for a virtual visit with a [facility] provider today.     Just as with appointments in the office, your consent must be obtained to participate.  Your consent will be active for this visit and any virtual visit you may have with one of our providers in the next 365 days.     If you have a MyChart account, a copy of this consent can be sent to you electronically.  All virtual visits are billed to your insurance company just like a traditional visit in the office.    As this is a virtual visit, video technology does not allow for your provider to perform a traditional examination.  This may limit your provider's ability to fully assess your condition.  If your provider identifies any concerns that need to be evaluated in person or the need to arrange testing (such as labs, EKG, etc.), we will make arrangements to do so.     Although advances in technology are sophisticated, we cannot ensure that it will always work on either your end or our end.  If the connection with a video visit is poor, the visit may have to be switched to a telephone visit.  With either a video or telephone visit, we are not always able to ensure that we have a secure connection.     I need to obtain your verbal consent now.   Are you willing to proceed with your visit today? Yes   Sharon Solis has provided verbal consent on 05/26/2021 for a virtual visit (video or telephone).   Sharon Solis, [place]   Date: 05/26/2021 6:22 PM   Virtual Visit via Video Note   I, Sharon Solis, connected with  Sharon Solis  ([id number], 26-Mar-1991) on 05/26/21 at  6:15 PM EST by a video-enabled telemedicine application and verified that I am speaking with the correct person using two identifiers.  Location: Patient: Virtual Visit Location Patient: Home Provider: Virtual Visit Location Provider: Home Office   I discussed the limitations of evaluation and  management by telemedicine and the availability of in person appointments. The patient expressed understanding and agreed to proceed.    History of Present Illness: Sharon Solis is a 31 y.o. who identifies as a female who was assigned female at birth, and is being seen today for of sore throat x 3 days ago.  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 States she looked at back of her throat and saw white patches. Has tried OTC medications without relief.  Symptoms are made worse with swallowing, but tolerating liquids and own secretions without difficulty.  Reports previous symptoms in the past with strep.   Complains of fatigue and nausea.  Denies fever, nausea, vomiting, runny nose, congestion, nausea, vomiting, diarrhea.     ROS: As per HPI.  All other pertinent ROS negative.     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 10/21/2018   Supervision of high risk pregnancy, antepartum 10/18/2018    Allergies: No Known Allergies Medications:  Current Outpatient Medications:    amoxicillin (AMOXIL) 500 MG capsule, Take 1 capsule (  500 mg total) by mouth 2 (two) times daily for 10 days., Disp: 30 capsule, Rfl: 0   acetaminophen (TYLENOL) 325 MG tablet, Take 2 tablets (650 mg total) by mouth every 6 (six) hours as needed (for pain scale < 4)., Disp: 30 tablet, Rfl: 0   atomoxetine (STRATTERA) 10 MG capsule, Take 10 mg by mouth daily., Disp: , Rfl:    cloNIDine (CATAPRES) 0.1 MG tablet, Take 0.1 mg by mouth 2 (two) times daily., Disp: , Rfl:    ibuprofen (ADVIL) 800 MG tablet, Take 1 tablet (800 mg total) by mouth every 6 (six) hours., Disp: 60 tablet, Rfl:  0   oxyCODONE (OXY IR/ROXICODONE) 5 MG immediate release tablet, Take 1 tablet (5 mg total) by mouth every 4 (four) hours as needed (pain scale 4-7)., Disp: 10 tablet, Rfl: 0   prenatal vitamin w/FE, FA (PRENATAL 1 + 1) 27-1 MG TABS tablet, Take 1 tablet by mouth daily at 12 noon., Disp: , Rfl:   Observations/Objective: Patient is well-developed, well-nourished in no acute distress.  Resting comfortably at home.  Head is normocephalic, atraumatic.  No labored breathing. Speaking in full sentences and tolerating own secretions Speech is clear and coherent with logical content.  Patient is alert and oriented at baseline.   Assessment and Plan: 1. Sore throat  Get plenty of rest and push fluids Amoxicillin prescribed.  Take as directed and to completeion Drink warm or cool liquids, use throat lozenges, or popsicles to help alleviate symptoms Take OTC ibuprofen or tylenol as needed for pain Follow up with PCP if symptoms persists Follow up in person at urgent care or go to ER if patient has any new or worsening symptoms such as fever, chills, nausea, vomiting, worsening sore throat, cough, abdominal pain, chest pain, changes in bowel or bladder habits, etc...  Follow Up Instructions: I discussed the assessment and treatment plan with the patient. The patient was provided an opportunity to ask questions and all were answered. The patient agreed with the plan and demonstrated an understanding of the instructions.  A copy of instructions were sent to the patient via MyChart unless otherwise noted below.   The patient was advised to call back or seek an in-person evaluation if the symptoms worsen or if the condition fails to improve as anticipated.  Time:  I spent 5-10 minutes with the patient via telehealth technology discussing the above problems/concerns.    Sharon Harding, PA-C

## 2021-05-26 NOTE — Patient Instructions (Signed)
°  Sharon Solis, thank you for joining [place], PA-C for today's virtual visit.  While this provider is not your primary care provider (PCP), if your PCP is located in our provider database this encounter information will be shared with them immediately following your visit.  Consent: (Patient) Sharon Solis provided verbal consent for this virtual visit at the beginning of the encounter.  Current Medications:  Current Outpatient Medications:    amoxicillin (AMOXIL) 500 MG capsule, Take 1 capsule (500 mg total) by mouth 2 (two) times daily for 10 days., Disp: 30 capsule, Rfl: 0   acetaminophen (TYLENOL) 325 MG tablet, Take 2 tablets (650 mg total) by mouth every 6 (six) hours as needed (for pain scale < 4)., Disp: 30 tablet, Rfl: 0   atomoxetine (STRATTERA) 10 MG capsule, Take 10 mg by mouth daily., Disp: , Rfl:    cloNIDine (CATAPRES) 0.1 MG tablet, Take 0.1 mg by mouth 2 (two) times daily., Disp: , Rfl:    ibuprofen (ADVIL) 800 MG tablet, Take 1 tablet (800 mg total) by mouth every 6 (six) hours., Disp: 60 tablet, Rfl: 0   oxyCODONE (OXY IR/ROXICODONE) 5 MG immediate release tablet, Take 1 tablet (5 mg total) by mouth every 4 (four) hours as needed (pain scale 4-7)., Disp: 10 tablet, Rfl: 0   prenatal vitamin w/FE, FA (PRENATAL 1 + 1) 27-1 MG TABS tablet, Take 1 tablet by mouth daily at 12 noon., Disp: , Rfl:    Medications ordered in this encounter:  Meds ordered this encounter  Medications   amoxicillin (AMOXIL) 500 MG capsule    Sig: Take 1 capsule (500 mg total) by mouth 2 (two) times daily for 10 days.    Dispense:  30 capsule    Refill:  0    Order Specific Question:   Supervising Provider    Answer:   Sabra Heck, [place]     *If you need refills on other medications prior to your next appointment, please contact your pharmacy*  Follow-Up: Call back or seek an in-person evaluation if the symptoms worsen or if the condition fails to improve as  anticipated.  Other Instructions Get plenty of rest and push fluids Amoxicillin prescribed.  Take as directed and to completeion Drink warm or cool liquids, use throat lozenges, or popsicles to help alleviate symptoms Take OTC ibuprofen or tylenol as needed for pain Follow up with PCP if symptoms persists Follow up in person at urgent care or go to ER if patient has any new or worsening symptoms such as fever, chills, nausea, vomiting, worsening sore throat, cough, abdominal pain, chest pain, changes in bowel or bladder habits, etc...   If you have been instructed to have an in-person evaluation today at a local Urgent Care facility, please use the link below. It will take you to a list of all of our available [facility], including address, phone number and hours of operation. Please do not delay care.  [facility]  If you or a family member do not have a primary care provider, use the link below to schedule a visit and establish care. When you choose a [facility] primary care physician or advanced practice provider, you gain a long-term partner in health. Find a Primary Care Provider  Learn more about [facility]'s in-office and virtual care options: [facility]

## 2021-05-30 DIAGNOSIS — F419 Anxiety disorder, unspecified: Secondary | ICD-10-CM | POA: Diagnosis not present

## 2021-05-30 DIAGNOSIS — R41840 Attention and concentration deficit: Secondary | ICD-10-CM | POA: Diagnosis not present

## 2021-06-09 ENCOUNTER — Telehealth: Payer: Medicaid Other | Admitting: Physician Assistant

## 2021-06-09 DIAGNOSIS — R1110 Vomiting, unspecified: Secondary | ICD-10-CM

## 2021-06-09 DIAGNOSIS — R197 Diarrhea, unspecified: Secondary | ICD-10-CM | POA: Diagnosis not present

## 2021-06-09 MED ORDER — PROMETHAZINE HCL 12.5 MG RE SUPP: 12.5000 mg | Freq: Four times a day (QID) | RECTAL | 0 refills | Status: AC | PRN

## 2021-06-09 MED ORDER — ONDANSETRON HCL 4 MG PO TABS: 4.0000 mg | ORAL_TABLET | Freq: Three times a day (TID) | ORAL | 0 refills | Status: AC | PRN

## 2021-06-09 NOTE — Progress Notes (Signed)
?Virtual Visit Consent  ? ?Sharon Solis, you are scheduled for a virtual visit with a [facility] provider today.   ?  ?Just as with appointments in the office, your consent must be obtained to participate.  Your consent will be active for this visit and any virtual visit you may have with one of our providers in the next 365 days.   ?  ?If you have a MyChart account, a copy of this consent can be sent to you electronically.  All virtual visits are billed to your insurance company just like a traditional visit in the office.   ? ?As this is a virtual visit, video technology does not allow for your provider to perform a traditional examination.  This may limit your provider's ability to fully assess your condition.  If your provider identifies any concerns that need to be evaluated in person or the need to arrange testing (such as labs, EKG, etc.), we will make arrangements to do so.   ?  ?Although advances in technology are sophisticated, we cannot ensure that it will always work on either your end or our end.  If the connection with a video visit is poor, the visit may have to be switched to a telephone visit.  With either a video or telephone visit, we are not always able to ensure that we have a secure connection.    ? ?I need to obtain your verbal consent now.   Are you willing to proceed with your visit today?  ?  ?Sharon Solis has provided verbal consent on 06/09/2021 for a virtual visit (video or telephone). ?  ?Sharon Solis  ? ?Date: 06/09/2021 3:36 PM ? ? ?Virtual Visit via Video Note  ? Sharon Solis, connected with  Sharon Solis  ([id number], 10/28/1997) on 06/09/21 at  3:30 PM EDT by a video-enabled telemedicine application and verified that I am speaking with the correct person using two identifiers. ? ?Location: ?Patient: Virtual Visit Location Patient: Home ?Provider: Virtual Visit Location Provider: Home Office ?  ?I discussed the limitations of evaluation and management  by telemedicine and the availability of in person appointments. The patient expressed understanding and agreed to proceed.   ? ?History of Present Illness: ?Sharon Solis is a 31 y.o. who identifies as a female who was assigned female at birth, and is being seen today for GI illness. ? ?Friday her toddler was sick with vomiting. Daughter had a quick GI bug that lasted less than 24 hours. This morning patient developed vomiting. Having diarrhea and chills. She has urinated a few times this morning but has not been able to keep any liquids or foods down since this morning. Denies: fever, SOB, severe dizziness/lightheadedness, chest pain, presence of blood in stool/emesis. ? ?HPI: HPI  ?Problems:  ?Patient Active Problem List  ? Diagnosis Date Noted  ? Unwanted fertility 03/31/2019  ? Gestational diabetes mellitus (GDM) treated with oral hypoglycemic therapy 01/27/2019  ? Abnormal findings on prenatal screening 11/09/2018  ? Group B streptococcal carriage in urine complicating pregnancy 10/24/2018  ? 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 Supervision of high risk pregnancy, antepartum 10/18/2018  ?  ?Allergies: No Known Allergies ?Medications:  ?Current Outpatient Medications:  ?  ondansetron (ZOFRAN) 4 MG tablet, Take 1 tablet (4 mg total) by mouth every 8 (eight) hours as needed for nausea or vomiting., Disp: 20 tablet, Rfl: 0 ?  promethazine (PHENERGAN) 12.5 MG suppository, Place 1 suppository (  12.5 mg total) rectally every 6 (six) hours as needed for nausea or vomiting., Disp: 12 each, Rfl: 0 ?  atomoxetine (STRATTERA) 10 MG capsule, Take 10 mg by mouth daily., Disp: , Rfl:  ?  cloNIDine (CATAPRES) 0.1 MG tablet, Take 0.1 mg by mouth 2 (two) times daily., Disp: , Rfl:  ? ?Observations/Objective: ?Patient is well-developed, well-nourished in no acute distress.  ?Resting comfortably  at home.  ?Head is normocephalic, atraumatic.  ?No labored breathing.  ?Speech is clear and coherent with logical  content.  ?Patient is alert and oriented at baseline.  ? ?Assessment and Plan: ?1. Vomiting and diarrhea ?Uncontrolled ?Suspect GI virus ?Trial oral zofran and if unsuccessful nausea/vomiting relief, may trial phenergan suppository ?If still unable to tolerate po's or further decline of UOP, recommend evaluation at ER for IVF ?If new dizziness or other concerns, recommend ER evaluation ? ?Follow Up Instructions: ?I discussed the assessment and treatment plan with the patient. The patient was provided an opportunity to ask questions and all were answered. The patient agreed with the plan and demonstrated an understanding of the instructions.  A copy of instructions were sent to the patient via MyChart unless otherwise noted below.  ? ? ?The patient was advised to call back or seek an in-person evaluation if the symptoms worsen or if the condition fails to improve as anticipated. ? ?Time:  ?I spent 5-10 minutes with the patient via telehealth technology discussing the above problems/concerns.   ? ?Sharon Solis ? ?

## 2021-06-29 ENCOUNTER — Ambulatory Visit
Admission: RE | Admit: 2021-06-29 | Discharge: 2021-06-29 | Disposition: A | Discharge status: Home / Self Care | Payer: Medicaid Other | Source: Ambulatory Visit | Attending: Urgent Care | Admitting: Urgent Care

## 2021-06-29 VITALS — BP 119/80 | HR 98 | Temp 99.1°F | Resp 18

## 2021-06-29 DIAGNOSIS — N898 Other specified noninflammatory disorders of vagina: Secondary | ICD-10-CM

## 2021-06-29 DIAGNOSIS — N760 Acute vaginitis: Secondary | ICD-10-CM | POA: Diagnosis not present

## 2021-06-29 DIAGNOSIS — B9689 Other specified bacterial agents as the cause of diseases classified elsewhere: Secondary | ICD-10-CM

## 2021-06-29 MED ORDER — METRONIDAZOLE 500 MG PO TABS: 500.0000 mg | ORAL_TABLET | Freq: Two times a day (BID) | ORAL | 0 refills | Status: DC

## 2021-06-29 NOTE — ED Provider Notes (Signed)
?[facility] ? ? ?MRN: [id number] DOB: 1990-11-12 ? ?Subjective:  ? ?Sharon Solis is a 31 y.o. female presenting for 1 week history of acute malodorous vaginal discharge.  Patient has a remote history of having recurrent bacterial vaginosis and her teenage years.  She has not had many issues for this recently.  However, at the end of February she did take an antibiotic course for 10 days.  Has not had any vaginal itching, redness or irritation.  She is sexually active with 1 partner. No protection.  Has a history of tubal ligation.  No concern for pregnancy.  She has no concern for STIs as well but is not opposed to the testing.  Denies fever, n/v, abdominal pain, pelvic pain, rashes, dysuria, urinary frequency, hematuria.  ? ? ?No current facility-administered medications for this encounter. ? ?Current Outpatient Medications:  ?  atomoxetine (STRATTERA) 10 MG capsule, Take 10 mg by mouth daily., Disp: , Rfl:  ?  cloNIDine (CATAPRES) 0.1 MG tablet, Take 0.1 mg by mouth 2 (two) times daily., Disp: , Rfl:  ?  ondansetron (ZOFRAN) 4 MG tablet, Take 1 tablet (4 mg total) by mouth every 8 (eight) hours as needed for nausea or vomiting., Disp: 20 tablet, Rfl: 0 ?  promethazine (PHENERGAN) 12.5 MG suppository, Place 1 suppository (12.5 mg total) rectally every 6 (six) hours as needed for nausea or vomiting., Disp: 12 each, Rfl: 0  ? ?No Known Allergies ? ?Past Medical History:  ?Diagnosis Date  ? Anemia   ? H/O  ? Depression   ? Family history of adverse reaction to anesthesia   ? PTS MOM IS HARD TO WAKE UP  ? Gestational diabetes   ? Headache   ? H/O  ?  ? ?Past Surgical History:  ?Procedure Laterality Date  ? CHOLECYSTECTOMY N/A 10/16/2015  ? Procedure: LAPAROSCOPIC CHOLECYSTECTOMY ;  Surgeon: Nadeen Landau, MD;  Location: [facility];  Service: General;  Laterality: N/A;  ? TUBAL LIGATION Bilateral 03/30/2019  ? Procedure: POST PARTUM TUBAL LIGATION;  Surgeon: Kathrynn Running, MD;  [facility];  Service: Gynecology;  Laterality: Bilateral;  ? ? ?Family History  ?Problem Relation Age of Onset  ? Hypothyroidism Mother   ? Cancer Father   ? ? ?Social History  ? ?Tobacco Use  ? Smoking status: Former  ? Smokeless tobacco: Never  ?Vaping Use  ? Vaping Use: Never used  ?Substance Use Topics  ? Alcohol use: No  ? Drug use: Not Currently  ?  Types: Marijuana  ?  Comment: None for several months now  ? ? ?ROS ? ? ?Objective:  ? ?Vitals: ?BP 119/80 (BP Location: Right Arm)   Pulse 98   Temp 99.1 ?F (37.3 ?C) (Oral)   Resp 18   LMP 06/14/2021 (Approximate)   SpO2 96%  ? ?Physical Exam ?Constitutional:   ?   General: She is not in acute distress. ?   Appearance: Normal appearance. She is well-developed. She is not ill-appearing, toxic-appearing or diaphoretic.  ?HENT:  ?   Head: Normocephalic and atraumatic.  ?   Nose: Nose normal.  ?   Mouth/Throat:  ?   Mouth: Mucous membranes are moist.  ?Eyes:  ?   General: No scleral icterus.    ?   Right eye: No discharge.     ?   Left eye: No discharge.  ?   Extraocular Movements: Extraocular movements intact.  ?Cardiovascular:  ?   Rate and Rhythm: Normal  rate.  ?Pulmonary:  ?   Effort: Pulmonary effort is normal.  ?Genitourinary: ?   Comments: Declined pelvic exam. ?Skin: ?   General: Skin is warm and dry.  ?Neurological:  ?   General: No focal deficit present.  ?   Mental Status: She is alert and oriented to person, place, and time.  ?Psychiatric:     ?   Mood and Affect: Mood normal.     ?   Behavior: Behavior normal.  ? ? ?Assessment and Plan :  ? ?PDMP not reviewed this encounter. ? ?1. Bacterial vaginosis   ?2. Vaginal discharge   ? ?I will treat patient empirically for recurrent bacterial vaginosis.  Offered her fluconazole but she prefers to wait for results for treatment.  On agreement.  Labs pending. Counseled patient on potential for adverse effects with medications prescribed/recommended today, ER and return-to-clinic precautions discussed, patient  verbalized understanding. ? ?  ?Wallis Bamberg, PA-C ?06/29/21 1328 ? ?

## 2021-06-29 NOTE — ED Triage Notes (Signed)
Vaginal discharge, green in color a few weeks ago that went away.  Foul smelling brown discharge x 1 week on and off ?

## 2021-07-01 LAB — CERVICOVAGINAL ANCILLARY ONLY
Bacterial Vaginitis (gardnerella): NEGATIVE
Candida Glabrata: NEGATIVE
Candida Vaginitis: POSITIVE — AB
Chlamydia: NEGATIVE
Comment: NEGATIVE
Comment: NEGATIVE
Comment: NEGATIVE
Comment: NEGATIVE
Comment: NEGATIVE
Comment: NORMAL
Neisseria Gonorrhea: NEGATIVE
Trichomonas: NEGATIVE

## 2021-07-02 ENCOUNTER — Telehealth (HOSPITAL_COMMUNITY): Payer: Self-pay | Admitting: Emergency Medicine

## 2021-07-02 MED ORDER — FLUCONAZOLE 150 MG PO TABS: 150.0000 mg | ORAL_TABLET | Freq: Once | ORAL | 0 refills | Status: AC

## 2021-07-11 DIAGNOSIS — R41840 Attention and concentration deficit: Secondary | ICD-10-CM | POA: Diagnosis not present

## 2021-07-11 DIAGNOSIS — F419 Anxiety disorder, unspecified: Secondary | ICD-10-CM | POA: Diagnosis not present

## 2021-07-12 ENCOUNTER — Ambulatory Visit
Admission: RE | Admit: 2021-07-12 | Discharge: 2021-07-12 | Disposition: A | Discharge status: Home / Self Care | Payer: Medicaid Other | Source: Ambulatory Visit | Attending: Urgent Care | Admitting: Urgent Care

## 2021-07-12 VITALS — BP 110/74 | HR 95 | Temp 99.2°F | Resp 18

## 2021-07-12 DIAGNOSIS — N949 Unspecified condition associated with female genital organs and menstrual cycle: Secondary | ICD-10-CM | POA: Insufficient documentation

## 2021-07-12 DIAGNOSIS — B3731 Acute candidiasis of vulva and vagina: Secondary | ICD-10-CM | POA: Diagnosis not present

## 2021-07-12 LAB — POCT URINALYSIS DIP (MANUAL ENTRY)
Bilirubin, UA: NEGATIVE
Blood, UA: NEGATIVE
Glucose, UA: NEGATIVE mg/dL
Ketones, POC UA: NEGATIVE mg/dL
Nitrite, UA: NEGATIVE
Protein Ur, POC: NEGATIVE mg/dL
Spec Grav, UA: 1.025 (ref 1.010–1.025)
Urobilinogen, UA: 0.2 E.U./dL
pH, UA: 7 (ref 5.0–8.0)

## 2021-07-12 LAB — POCT URINE PREGNANCY: Preg Test, Ur: NEGATIVE

## 2021-07-12 MED ORDER — FLUCONAZOLE 150 MG PO TABS: 150.0000 mg | ORAL_TABLET | ORAL | 0 refills | Status: DC

## 2021-07-12 NOTE — ED Triage Notes (Signed)
Pt reports vaginal odor x 2 weeks.States she was treated for vaginal discharge with fluconazole, but the odor is still there.  ?

## 2021-07-12 NOTE — ED Provider Notes (Signed)
?[facility] ? ? ?MRN: [id number] DOB: Jan 28, 1991 ? ?Subjective:  ? ?Sharon Solis is a 31 y.o. female presenting for recheck on persistent vaginal malodor.  Patient was last seen in 06/29/2021.  She was initially treated empirically for bacterial vaginosis with metronidazole.  She took this to completion.  The cervical swab results showed yeast infection, RN Pelkey contacted patient and got her on fluconazole.  Reports that the symptoms help some but feels like they are still lingering.  No fever, nausea, vomiting, abdominal or pelvic pain, dysuria, urinary frequency.  Patient does have her tubes tied but would like to do a complete check including for an urinary tract infection, pregnancy and recheck on BV and yeast infection.  She does have a fianc? and therefore does not need a recheck on the STIs. ? ?No current facility-administered medications for this encounter. ? ?Current Outpatient Medications:  ?  atomoxetine (STRATTERA) 10 MG capsule, Take 10 mg by mouth daily., Disp: , Rfl:  ?  cloNIDine (CATAPRES) 0.1 MG tablet, Take 0.1 mg by mouth 2 (two) times daily., Disp: , Rfl:  ?  metroNIDAZOLE (FLAGYL) 500 MG tablet, Take 1 tablet (500 mg total) by mouth 2 (two) times daily with a meal. DO NOT CONSUME ALCOHOL WHILE TAKING THIS MEDICATION., Disp: 14 tablet, Rfl: 0 ?  ondansetron (ZOFRAN) 4 MG tablet, Take 1 tablet (4 mg total) by mouth every 8 (eight) hours as needed for nausea or vomiting., Disp: 20 tablet, Rfl: 0 ?  promethazine (PHENERGAN) 12.5 MG suppository, Place 1 suppository (12.5 mg total) rectally every 6 (six) hours as needed for nausea or vomiting., Disp: 12 each, Rfl: 0  ? ?No Known Allergies ? ?Past Medical History:  ?Diagnosis Date  ? Anemia   ? H/O  ? Depression   ? Family history of adverse reaction to anesthesia   ? PTS MOM IS HARD TO WAKE UP  ? Gestational diabetes   ? Headache   ? H/O  ?  ? ?Past Surgical History:  ?Procedure Laterality Date  ? CHOLECYSTECTOMY N/A  10/16/2015  ? Procedure: LAPAROSCOPIC CHOLECYSTECTOMY ;  Surgeon: Leonie Green, MD;  Location: [facility];  Service: General;  Laterality: N/A;  ? TUBAL LIGATION Bilateral 03/30/2019  ? Procedure: POST PARTUM TUBAL LIGATION;  Surgeon: Gwynne Edinger, MD;  Location: [facility];  Service: Gynecology;  Laterality: Bilateral;  ? ? ?Family History  ?Problem Relation Age of Onset  ? Hypothyroidism Mother   ? Cancer Father   ? ? ?Social History  ? ?Tobacco Use  ? Smoking status: Former  ? Smokeless tobacco: Never  ?Vaping Use  ? Vaping Use: Never used  ?Substance Use Topics  ? Alcohol use: No  ? Drug use: Not Currently  ?  Types: Marijuana  ?  Comment: None for several months now  ? ? ?ROS ? ? ?Objective:  ? ?Vitals: ?BP 110/74 (BP Location: Right Arm)   Pulse 95   Temp 99.2 ?F (37.3 ?C) (Oral)   Resp 18   LMP  (Within Weeks) Comment: 3 weeks  SpO2 95%   Breastfeeding No  ? ?Physical Exam ?Constitutional:   ?   General: She is not in acute distress. ?   Appearance: Normal appearance. She is well-developed. She is not ill-appearing, toxic-appearing or diaphoretic.  ?HENT:  ?   Head: Normocephalic and atraumatic.  ?   Nose: Nose normal.  ?   Mouth/Throat:  ?   Mouth: Mucous membranes are moist.  ?Eyes:  ?  General: No scleral icterus.    ?   Right eye: No discharge.     ?   Left eye: No discharge.  ?   Extraocular Movements: Extraocular movements intact.  ?   Conjunctiva/sclera: Conjunctivae normal.  ?Cardiovascular:  ?   Rate and Rhythm: Normal rate.  ?Pulmonary:  ?   Effort: Pulmonary effort is normal.  ?Abdominal:  ?   General: Bowel sounds are normal. There is no distension.  ?   Palpations: Abdomen is soft. There is no mass.  ?   Tenderness: There is no abdominal tenderness. There is no right CVA tenderness, left CVA tenderness, guarding or rebound.  ?Skin: ?   General: Skin is warm and dry.  ?Neurological:  ?   General: No focal deficit present.  ?   Mental Status: She is alert and oriented to person,  place, and time.  ?Psychiatric:     ?   Mood and Affect: Mood normal.     ?   Behavior: Behavior normal.     ?   Thought Content: Thought content normal.     ?   Judgment: Judgment normal.  ? ? ?Results for orders placed or performed during the hospital encounter of 07/12/21 (from the past 24 hour(s))  ?POCT urinalysis dipstick     Status: Abnormal  ? Collection Time: 07/12/21 12:17 PM  ?Result Value Ref Range  ? Color, UA yellow yellow  ? Clarity, UA hazy (A) clear  ? Glucose, UA negative negative mg/dL  ? Bilirubin, UA negative negative  ? Ketones, POC UA negative negative mg/dL  ? Spec Grav, UA 1.025 1.010 - 1.025  ? Blood, UA negative negative  ? pH, UA 7.0 5.0 - 8.0  ? Protein Ur, POC negative negative mg/dL  ? Urobilinogen, UA 0.2 0.2 or 1.0 E.U./dL  ? Nitrite, UA Negative Negative  ? Leukocytes, UA Trace (A) Negative  ?POCT urine pregnancy     Status: None  ? Collection Time: 07/12/21 12:18 PM  ?Result Value Ref Range  ? Preg Test, Ur Negative Negative  ? ? ?Recent Results (from the past 2160 hour(s))  ?Cervicovaginal ancillary only     Status: Abnormal  ? Collection Time: 06/29/21  1:08 PM  ?Result Value Ref Range  ? Neisseria Gonorrhea Negative   ? Chlamydia Negative   ? Trichomonas Negative   ? Bacterial Vaginitis (gardnerella) Negative   ? Candida Vaginitis Positive (A)   ? Candida Glabrata Negative   ? Comment    ?  Normal Reference Range Bacterial Vaginosis - Negative  ? Comment Normal Reference Range Candida Species - Negative   ? Comment Normal Reference Range Candida Galbrata - Negative   ? Comment Normal Reference Range Trichomonas - Negative   ? Comment Normal Reference Ranger Chlamydia - Negative   ? Comment    ?  Normal Reference Range Neisseria Gonorrhea - Negative  ? ? ? ?Assessment and Plan :  ? ?PDMP not reviewed this encounter. ? ?1. Vaginal discomfort   ?2. Yeast vaginitis   ? ?Patient would like to go ahead and cover for persistent yeast vaginitis.  I was agreeable.  Cervical swab results  pending.  We will defer an urine culture as she had no urinary symptoms and the urinalysis was not really suggestive of a UTI. Counseled patient on potential for adverse effects with medications prescribed/recommended today, ER and return-to-clinic precautions discussed, patient verbalized understanding. ? ?  ?Jaynee Eagles, PA-C ?07/12/21 1226 ? ?

## 2021-07-15 LAB — CERVICOVAGINAL ANCILLARY ONLY
Bacterial Vaginitis (gardnerella): NEGATIVE
Candida Glabrata: NEGATIVE
Candida Vaginitis: NEGATIVE
Comment: NEGATIVE
Comment: NEGATIVE
Comment: NEGATIVE

## 2021-07-26 ENCOUNTER — Encounter: Payer: Self-pay | Admitting: Radiology

## 2021-07-26 ENCOUNTER — Encounter: Payer: Self-pay | Admitting: Emergency Medicine

## 2021-07-26 ENCOUNTER — Ambulatory Visit
Admission: RE | Admit: 2021-07-26 | Discharge status: Absent without leave | Payer: Medicaid Other | Source: Ambulatory Visit

## 2021-07-26 ENCOUNTER — Ambulatory Visit
Admission: EM | Admit: 2021-07-26 | Discharge: 2021-07-26 | Disposition: A | Discharge status: Home / Self Care | Payer: Medicaid Other | Attending: Nurse Practitioner | Admitting: Nurse Practitioner

## 2021-07-26 ENCOUNTER — Other Ambulatory Visit: Payer: Self-pay

## 2021-07-26 DIAGNOSIS — N898 Other specified noninflammatory disorders of vagina: Secondary | ICD-10-CM | POA: Diagnosis not present

## 2021-07-26 DIAGNOSIS — Z0289 Encounter for other administrative examinations: Secondary | ICD-10-CM

## 2021-07-26 MED ORDER — METRONIDAZOLE 0.75 % VA GEL: 1.0000 | Freq: Every day | VAGINAL | 0 refills | Status: AC

## 2021-07-26 NOTE — ED Triage Notes (Signed)
Pt reports was seen for same in the past but reports would like to be tested for aerobic vaginitis. Pt reports history of group B strep and is currently being evaluated for PCOS.  ? ?Pt reports was treated for yeast infection and reports symptoms have improved but reports continued odor and intermittent discharge.  ? ?Has tried boric acid in the past but reports symptoms seem to always return.  ?

## 2021-07-26 NOTE — Discharge Instructions (Addendum)
We will start you on the MetroGel to see if this helps her symptoms. ?As discussed, further evaluation by OB is recommended. ?You can contact [facility] at [phone] to schedule an appointment. ?I would avoid sexual intercourse until symptoms improve. ?You will be contacted if your cytology results are positive.  You also have access to your results via MyChart. ?Follow-up as needed. ?

## 2021-07-26 NOTE — ED Provider Notes (Signed)
[facility]    CSN: [id number] Arrival date & time: 07/26/21  0853      History   Chief Complaint Chief Complaint  Patient presents with   Vaginal Discharge    HPI Sharon Solis is a 31 y.o. female.   The patient is a 31 year old female who presents for vaginal symptoms.  Symptoms have been present for the past 3 to 4 weeks.  Patient has been treated for both yeast and BV.  She states that she has also received testing for BV and yeast.  She was started on metronidazole tablets, but that medication was stopped because her cytology resulted positive for yeast.  She states that she was given 2 courses of fluconazole, which she completed.  She continues to have dyspareunia, discharge, and vaginal odor.  She describes the discharge as dark, to yellow, to green.  She continues to have a foul-smelling vaginal odor.  She is sexually active, she is not concerned about an STI as she has 1 partner.  Her last menstrual cycle was 07/20/2021.  States that she is currently being worked up for PCOS.  She was scheduled to see OB today, but the appointment was canceled because she is not pregnant and they do not take her insurance.  The history is provided by the patient.  Vaginal Discharge Quality:  Bloody, dark, malodorous and yellow Severity:  Moderate Progression:  Waxing and waning Context: after intercourse and spontaneously   Context: not during urination and not recent antibiotic use   Relieved by: has tried diflucan, was started on metronidazole tablets but medication was stopped because patient test positie for yeast. Associated symptoms: dyspareunia   Associated symptoms: no urinary frequency, no urinary hesitancy, no urinary incontinence and no vaginal itching    Past Medical History:  Diagnosis Date   Anemia    H/O   Depression    Family history of adverse reaction to anesthesia    PTS MOM IS HARD TO WAKE UP   Gestational diabetes    Headache    H/O    Patient  Active Problem List   Diagnosis Date Noted   Unwanted fertility 03/31/2019   Gestational diabetes mellitus (GDM) treated with oral hypoglycemic therapy 01/27/2019   Abnormal findings on prenatal screening 11/09/2018   Group B streptococcal carriage in urine complicating pregnancy 10/24/2018   LGSIL on Pap smear of cervix on 10/18/18 10/21/2018   Supervision of high risk pregnancy, antepartum 10/18/2018    Past Surgical History:  Procedure Laterality Date   CHOLECYSTECTOMY N/A 10/16/2015   Procedure: LAPAROSCOPIC CHOLECYSTECTOMY ;  Surgeon: Nadeen Landau, MD;  Location: [facility];  Service: General;  Laterality: N/A;   TUBAL LIGATION Bilateral 03/30/2019   Procedure: POST PARTUM TUBAL LIGATION;  Surgeon: Kathrynn Running, MD;  Location: [facility];  Service: Gynecology;  Laterality: Bilateral;    OB History     Gravida  3   Para  3   Term  3   Preterm      AB      Living  3      SAB      IAB      Ectopic      Multiple  0   Live Births  3            Home Medications    Prior to Admission medications   Medication Sig Start Date End Date Taking? Authorizing Provider  metroNIDAZOLE (METROGEL) 0.75 % vaginal gel Place 1  Applicatorful vaginally at bedtime for 7 days. 07/26/21 08/02/21 Yes Leath-Warren, Sadie Haber, NP  atomoxetine (STRATTERA) 10 MG capsule Take 10 mg by mouth daily.    [provider]  cloNIDine (CATAPRES) 0.1 MG tablet Take 0.1 mg by mouth 2 (two) times daily. 05/21/21   [provider]  fluconazole (DIFLUCAN) 150 MG tablet Take 1 tablet (150 mg total) by mouth once a week. 07/12/21   Wallis Bamberg, PA-C  metroNIDAZOLE (FLAGYL) 500 MG tablet Take 1 tablet (500 mg total) by mouth 2 (two) times daily with a meal. DO NOT CONSUME ALCOHOL WHILE TAKING THIS MEDICATION. 06/29/21   Wallis Bamberg, PA-C  ondansetron (ZOFRAN) 4 MG tablet Take 1 tablet (4 mg total) by mouth every 8 (eight) hours as needed for nausea or vomiting. 06/09/21   [name], PA  promethazine (PHENERGAN) 12.5 MG suppository Place 1 suppository (12.5 mg total) rectally every 6 (six) hours as needed for nausea or vomiting. 06/09/21   [name], PA    Family History Family History  Problem Relation Age of Onset   Hypothyroidism Mother    Cancer Father     Social History Social History   Tobacco Use   Smoking status: Former   Smokeless tobacco: Never  Building services engineer Use: Never used  Substance Use Topics   Alcohol use: No   Drug use: Not Currently    Types: Marijuana    Comment: None for several months now     Allergies   Patient has no known allergies.   Review of Systems Review of Systems  Constitutional: Negative.   Respiratory: Negative.    Gastrointestinal: Negative.   Genitourinary:  Positive for dyspareunia and vaginal discharge. Negative for bladder incontinence, hesitancy, menstrual problem and pelvic pain.  Skin: Negative.   Psychiatric/Behavioral: Negative.      Physical Exam Triage Vital Signs ED Triage Vitals  Enc Vitals Group     BP 07/26/21 0927 (!) 145/86     Pulse Rate 07/26/21 0927 (!) 105     Resp 07/26/21 0927 18     Temp 07/26/21 0927 98.5 F (36.9 C)     Temp Source 07/26/21 0927 Oral     SpO2 07/26/21 0927 95 %     Weight 07/26/21 0928 230 lb (104.3 kg)     Height 07/26/21 0928 5\' 6"  (1.676 m)     Head Circumference --      Peak Flow --      Pain Score 07/26/21 0928 0     Pain Loc --      Pain Edu? --      Excl. in GC? --    No data found.  Updated Vital Signs BP (!) 145/86 (BP Location: Right Arm)   Pulse (!) 105   Temp 98.5 F (36.9 C) (Oral)   Resp 18   Ht 5\' 6"  (1.676 m)   Wt 230 lb (104.3 kg)   LMP 07/22/2021 (Approximate)   SpO2 95%   BMI 37.12 kg/m   Visual Acuity Right Eye Distance:   Left Eye Distance:   Bilateral Distance:    Right Eye Near:   Left Eye Near:    Bilateral Near:     Physical Exam Vitals reviewed.  Constitutional:      General: She is not  in acute distress.    Appearance: She is well-developed.  HENT:     Head: Normocephalic.  Eyes:     Extraocular Movements: Extraocular movements intact.  Pupils: Pupils are equal, round, and reactive to light.  Cardiovascular:     Rate and Rhythm: Normal rate and regular rhythm.     Heart sounds: Normal heart sounds.  Pulmonary:     Effort: Pulmonary effort is normal.     Breath sounds: Normal breath sounds.  Abdominal:     General: Bowel sounds are normal. There is no distension.     Palpations: Abdomen is soft.     Tenderness: There is no abdominal tenderness. There is no guarding or rebound.  Genitourinary:    Comments: Self swab performed by patient Musculoskeletal:     Cervical back: Normal range of motion.  Skin:    General: Skin is warm and dry.     Findings: No erythema or rash.  Neurological:     Mental Status: She is alert and oriented to person, place, and time.     Cranial Nerves: No cranial nerve deficit.  Psychiatric:        Mood and Affect: Mood normal.        Behavior: Behavior normal.     UC Treatments / Results  Labs (all labs ordered are listed, but only abnormal results are displayed) Labs Reviewed  CERVICOVAGINAL ANCILLARY ONLY    EKG   Radiology No results found.  Procedures Procedures (including critical care time)  Medications Ordered in UC Medications - No data to display  Initial Impression / Assessment and Plan / UC Course  I have reviewed the triage vital signs and the nursing notes.  Pertinent labs & imaging results that were available during my care of the patient were reviewed by me and considered in my medical decision making (see chart for details).  The patient is a 31 year old female who continues to have symptoms of vaginitis.  Symptoms have been present for the past 3 to 4 weeks.  She has been treated with fluconazole, but she states her symptoms have never fully resolved.  She continues to have vaginal discharge that is  malodorous and discolored.  A previous cytology result showed that she did have yeast.  We will perform another cytology in the event the patient's symptoms are now related to BV.  We will also provide a prescription for MetroGel to see if this helps with her symptoms.  Patient advised that she will need to follow-up with gynecology for persistent or continued symptoms.  Patient was given the information for [facility] for further evaluation.  Patient advised to follow-up as needed. Final Clinical Impressions(s) / UC Diagnoses   Final diagnoses:  Vaginal odor  Vaginal discharge     Discharge Instructions      We will start you on the MetroGel to see if this helps her symptoms. As discussed, further evaluation by OB is recommended. You can contact [facility] at [phone] to schedule an appointment. I would avoid sexual intercourse until symptoms improve. You will be contacted if your cytology results are positive.  You also have access to your results via MyChart. Follow-up as needed.     ED Prescriptions     Medication Sig Dispense Auth. Provider   metroNIDAZOLE (METROGEL) 0.75 % vaginal gel Place 1 Applicatorful vaginally at bedtime for 7 days. 70 g Leath-Warren, Sadie Haber, NP      PDMP not reviewed this encounter.   Abran Cantor, NP 07/26/21 1151

## 2021-07-29 LAB — CERVICOVAGINAL ANCILLARY ONLY
Bacterial Vaginitis (gardnerella): NEGATIVE
Candida Glabrata: NEGATIVE
Candida Vaginitis: NEGATIVE
Comment: NEGATIVE
Comment: NEGATIVE
Comment: NEGATIVE

## 2021-08-02 ENCOUNTER — Encounter (HOSPITAL_COMMUNITY): Payer: Self-pay

## 2021-08-02 ENCOUNTER — Emergency Department (HOSPITAL_COMMUNITY)
Admission: EM | Admit: 2021-08-02 | Discharge: 2021-08-02 | Disposition: A | Discharge status: Home / Self Care | Payer: Medicaid Other | Attending: Student | Admitting: Student

## 2021-08-02 ENCOUNTER — Other Ambulatory Visit: Payer: Self-pay

## 2021-08-02 DIAGNOSIS — X58XXXA Exposure to other specified factors, initial encounter: Secondary | ICD-10-CM | POA: Insufficient documentation

## 2021-08-02 DIAGNOSIS — N898 Other specified noninflammatory disorders of vagina: Secondary | ICD-10-CM | POA: Diagnosis not present

## 2021-08-02 DIAGNOSIS — T192XXA Foreign body in vulva and vagina, initial encounter: Secondary | ICD-10-CM | POA: Insufficient documentation

## 2021-08-02 DIAGNOSIS — Z87891 Personal history of nicotine dependence: Secondary | ICD-10-CM | POA: Diagnosis not present

## 2021-08-02 DIAGNOSIS — Z79899 Other long term (current) drug therapy: Secondary | ICD-10-CM | POA: Insufficient documentation

## 2021-08-02 DIAGNOSIS — R102 Pelvic and perineal pain: Secondary | ICD-10-CM | POA: Diagnosis present

## 2021-08-02 LAB — WET PREP, GENITAL
Clue Cells Wet Prep HPF POC: NONE SEEN
Sperm: NONE SEEN
Trich, Wet Prep: NONE SEEN
WBC, Wet Prep HPF POC: NONE SEEN — AB (ref <10)
Yeast Wet Prep HPF POC: NONE SEEN

## 2021-08-02 NOTE — ED Provider Notes (Signed)
?[facility] ?Provider Note ? ?CSN: [id number] ?Arrival date & time: 08/02/21 0840 ? ?Chief Complaint(s) ?Vaginal Prolapse ? ?HPI ?Sharon Solis is a 31 y.o. female who presents to the emergency department for evaluation of a concern for vaginal prolapse.  Patient states that she has been intermittently treated for BV despite negative testing and states that she has not had a pelvic exam for this.  She states that today she noticed that her cervix was in the vaginal opening and came to the ER due to concern for prolapse.  She denies bleeding, chest pain, shortness of breath, abdominal pain, nausea, vomiting or other systemic symptoms. ? ? ?Past Medical History ?Past Medical History:  ?Diagnosis Date  ? Anemia   ? H/O  ? Depression   ? Family history of adverse reaction to anesthesia   ? PTS MOM IS HARD TO WAKE UP  ? Gestational diabetes   ? Headache   ? H/O  ? ?Patient Active Problem List  ? Diagnosis Date Noted  ? Unwanted fertility 03/31/2019  ? Gestational diabetes mellitus (GDM) treated with oral hypoglycemic therapy 01/27/2019  ? Abnormal findings on prenatal screening 11/09/2018  ? Group B streptococcal carriage in urine complicating pregnancy 10/24/2018  ? 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 Supervision of high risk pregnancy, antepartum 10/18/2018  ? ?Home Medication(s) ?Prior to Admission medications   ?Medication Sig Start Date End Date Taking? Authorizing Provider  ?atomoxetine (STRATTERA) 10 MG capsule Take 10 mg by mouth daily.    [provider]  ?cloNIDine (CATAPRES) 0.1 MG tablet Take 0.1 mg by mouth 2 (two) times daily. 05/21/21   [provider]  ?fluconazole (DIFLUCAN) 150 MG tablet Take 1 tablet (150 mg total) by mouth once a week. 07/12/21   Wallis Bamberg, PA-C  ?metroNIDAZOLE (FLAGYL) 500 MG tablet Take 1 tablet (500 mg total) by mouth 2 (two) times daily with a meal. DO NOT CONSUME ALCOHOL WHILE TAKING THIS MEDICATION. 06/29/21   Wallis Bamberg, PA-C  ?metroNIDAZOLE (METROGEL) 0.75 % vaginal gel Place 1 Applicatorful vaginally at bedtime for 7 days. 07/26/21 08/02/21  Leath-Warren, Sadie Haber, NP  ?ondansetron (ZOFRAN) 4 MG tablet Take 1 tablet (4 mg total) by mouth every 8 (eight) hours as needed for nausea or vomiting. 06/09/21   [name], PA  ?promethazine (PHENERGAN) 12.5 MG suppository Place 1 suppository (12.5 mg total) rectally every 6 (six) hours as needed for nausea or vomiting. 06/09/21   [name], PA  ?                                                                                                                                  ?Past Surgical History ?Past Surgical History:  ?Procedure Laterality Date  ? CHOLECYSTECTOMY N/A 10/16/2015  ? Procedure: LAPAROSCOPIC CHOLECYSTECTOMY ;  Surgeon: Nadeen Landau, MD;  Location: [facility] ORS;  Service: General;  Laterality: N/A;  ?  TUBAL LIGATION Bilateral 03/30/2019  ? Procedure: POST PARTUM TUBAL LIGATION;  Surgeon: Kathrynn Running, MD;  Location: [facility];  Service: Gynecology;  Laterality: Bilateral;  ? ?Family History ?Family History  ?Problem Relation Age of Onset  ? Hypothyroidism Mother   ? Cancer Father   ? ? ?Social History ?Social History  ? ?Tobacco Use  ? Smoking status: Former  ? Smokeless tobacco: Never  ?Vaping Use  ? Vaping Use: Never used  ?Substance Use Topics  ? Alcohol use: No  ? Drug use: Not Currently  ?  Types: Marijuana  ?  Comment: None for several months now  ? ?Allergies ?Patient has no known allergies. ? ?Review of Systems ?Review of Systems  ?Genitourinary:   ?     Vaginal mass  ? ?Physical Exam ?Vital Signs  ?I have reviewed the triage vital signs ?BP (!) 128/96 (BP Location: Right Arm)   Pulse 100   Temp 97.7 ?F (36.5 ?C) (Oral)   Resp 16   Ht 5\' 6"  (1.676 m)   Wt 102.1 kg   LMP 07/22/2021 (Approximate)   SpO2 99%   BMI 36.32 kg/m?  ? ?Physical Exam ?Vitals and nursing note reviewed.  ?Constitutional:   ?   General: She is not in acute  distress. ?   Appearance: She is well-developed.  ?HENT:  ?   Head: Normocephalic and atraumatic.  ?Eyes:  ?   Conjunctiva/sclera: Conjunctivae normal.  ?Cardiovascular:  ?   Rate and Rhythm: Normal rate and regular rhythm.  ?   Heart sounds: No murmur heard. ?Pulmonary:  ?   Effort: Pulmonary effort is normal. No respiratory distress.  ?   Breath sounds: Normal breath sounds.  ?Abdominal:  ?   Palpations: Abdomen is soft.  ?   Tenderness: There is no abdominal tenderness.  ?Genitourinary: ?   Comments: Copious foul-smelling discharge in the vagina, retained tampon seen ?Musculoskeletal:     ?   General: No swelling.  ?   Cervical back: Neck supple.  ?Skin: ?   General: Skin is warm and dry.  ?   Capillary Refill: Capillary refill takes less than 2 seconds.  ?Neurological:  ?   Mental Status: She is alert.  ?Psychiatric:     ?   Mood and Affect: Mood normal.  ? ? ?ED Results and Treatments ?Labs ?(all labs ordered are listed, but only abnormal results are displayed) ?Labs Reviewed  ?WET PREP, GENITAL  ?GC/CHLAMYDIA PROBE AMP ([place]) NOT AT [facility]  ?                                                                                                                       ? ?Radiology ?No results found. ? ?Pertinent labs & imaging results that were available during my care of the patient were reviewed by me and considered in my medical decision making (see MDM for details). ? ?Medications Ordered in ED ?Medications - No data to display                                                               ?                                                                    ?  Procedures ?Marland Kitchen.Foreign Body Removal ? ?Date/Time: 08/02/2021 10:53 AM ?Performed by: Glendora ScoreKommor, Hiedi Touchton, MD ?Authorized by: Glendora ScoreKommor, Esdras Delair, MD  ?Body area: vagina ? ?Sedation: ?Patient sedated: no ? ?Removal mechanism: ring forceps ?Complexity: simple ?1 objects recovered. ?Objects recovered: retained tampon ?Post-procedure assessment: foreign body  removed ? ? ?(including critical care time) ? ?Medical Decision Making / ED Course ? ? ?This patient presents to the ED for concern of vaginal discharge, vaginal mass, this involves an extensive number of treatment options, and is a complaint that carries with it a high risk of complications and morbidity.  The differential diagnosis includes uterine prolapse, bladder prolapse, STI, retained vaginal foreign body cervical cancer ? ?MDM: ?Patient seen emergency department for evaluation of vaginal discharge and a possible vaginal mass.  Physical exam including pelvic exam reveals a retained tampon with copious foul-smelling discharge that was removed at bedside.  Wet prep negative.  Patient presentation likely consistent with retained vaginal foreign body and this does not warrant antibiotics by itself.  Thus, she will be discharged with resources set up an outpatient OB/GYN.  Patient then discharged with return precautions. ? ? ?Additional history obtained: ?-Additional history obtained from husband  ?-External records from outside source obtained and reviewed including: Chart review including previous notes, labs, imaging, consultation notes ? ? ?Lab Tests: ?-I ordered, reviewed, and interpreted labs.   ?The pertinent results include:   ?Labs Reviewed  ?WET PREP, GENITAL  ?GC/CHLAMYDIA PROBE AMP ([place]) NOT AT [facility]  ?  ? ? ? ?Medicines ordered and prescription drug management: ?No orders of the defined types were placed in this encounter. ?  ?-I have reviewed the patients home medicines and have made adjustments as needed ? ?Critical interventions ?none ? ? ? ?Cardiac Monitoring: ?The patient was maintained on a cardiac monitor.  I personally viewed and interpreted the cardiac monitored which showed an underlying rhythm of: NSR ? ?Social Determinants of Health:  ?Factors impacting patients care include: none ? ? ?Reevaluation: ?After the interventions noted above, I reevaluated the patient and found that they  have :improved ? ?Co morbidities that complicate the patient evaluation ? ?Past Medical History:  ?Diagnosis Date  ? Anemia   ? H/O  ? Depression   ? Family history of adverse reaction to anesthesia   ? PTS MOM IS HARD TO WAKE UP  ? Ge

## 2021-08-02 NOTE — ED Triage Notes (Signed)
Pt states she notices uterine prolapse starting today when she went to the bathroom. Pt states she has been treated for BV a couple times at [facility] ?

## 2021-08-05 LAB — GC/CHLAMYDIA PROBE AMP (~~LOC~~) NOT AT ARMC
Chlamydia: NEGATIVE
Comment: NEGATIVE
Comment: NORMAL
Neisseria Gonorrhea: NEGATIVE

## 2022-04-08 DIAGNOSIS — Z719 Counseling, unspecified: Secondary | ICD-10-CM | POA: Diagnosis not present

## 2023-04-05 DIAGNOSIS — X788XXA Intentional self-harm by other sharp object, initial encounter: Secondary | ICD-10-CM | POA: Insufficient documentation

## 2023-04-05 DIAGNOSIS — R4588 Nonsuicidal self-harm: Secondary | ICD-10-CM | POA: Diagnosis present

## 2023-04-05 DIAGNOSIS — S71111A Laceration without foreign body, right thigh, initial encounter: Secondary | ICD-10-CM | POA: Diagnosis not present

## 2023-04-06 ENCOUNTER — Encounter (HOSPITAL_COMMUNITY): Payer: Self-pay

## 2023-04-06 ENCOUNTER — Other Ambulatory Visit: Payer: Self-pay

## 2023-04-06 ENCOUNTER — Emergency Department (HOSPITAL_COMMUNITY)
Admission: EM | Admit: 2023-04-06 | Discharge: 2023-04-06 | Disposition: A | Discharge status: Home / Self Care | Payer: Medicaid Other | Attending: Emergency Medicine | Admitting: Emergency Medicine

## 2023-04-06 DIAGNOSIS — Z7289 Other problems related to lifestyle: Secondary | ICD-10-CM
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
## 2023-04-06 NOTE — ED Notes (Signed)
 ED Provider at bedside.
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
## 2023-04-06 NOTE — Discharge Instructions (Addendum)
 Please make sure to speak with your psychiatrist today.  If at any point you have thoughts of hurting yourself in any way, please return to the emergency department immediately.
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
## 2023-04-08 IMAGING — US US PELVIS COMPLETE WITH TRANSVAGINAL
1 series · 14 of 25 positions shown · non-contrast
Comparison: 07/04/2020

CLINICAL DATA: Complex left ovarian cyst follow-up

EXAM:
TRANSABDOMINAL AND TRANSVAGINAL ULTRASOUND OF PELVIS
TECHNIQUE: Both transabdominal and transvaginal ultrasound examinations of the
pelvis were performed. Transabdominal technique was performed for
global imaging of the pelvis including uterus, ovaries, adnexal
regions, and pelvic cul-de-sac. It was necessary to proceed with
endovaginal exam following the transabdominal exam to visualize the
endometrium and ovaries.

[Series 1: us pelvis complete with transvaginal · 0.28mm/px · 14 of 81 slices shown]
[im 1/81]
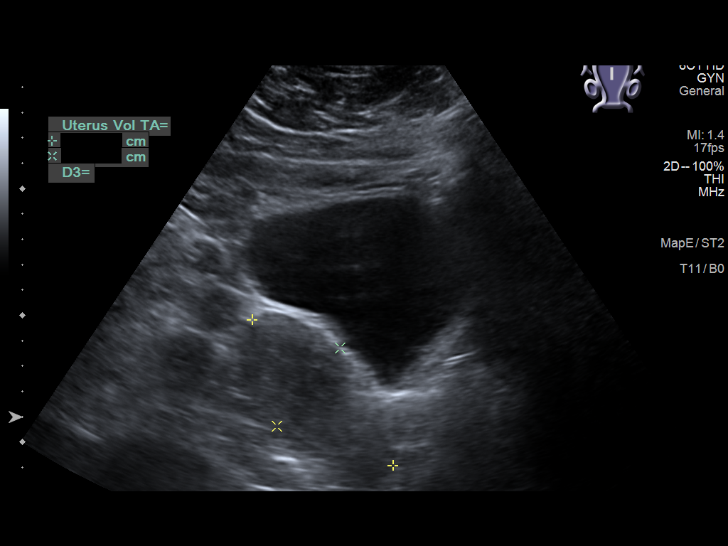
[im 7/81]
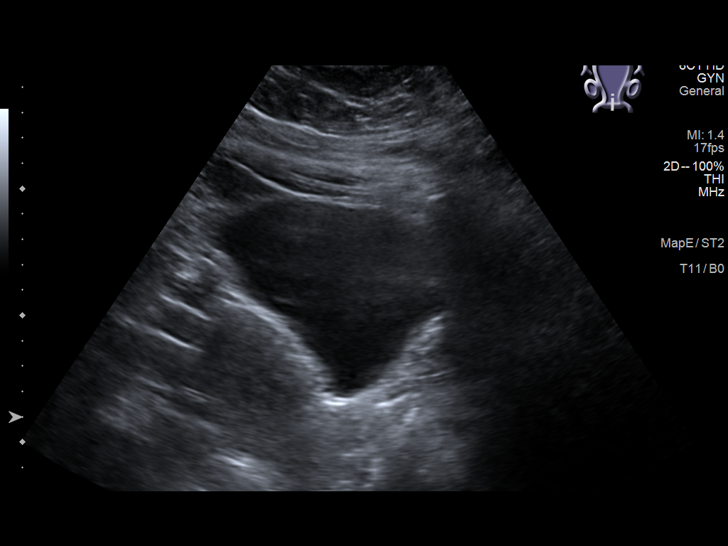
[im 14/81]
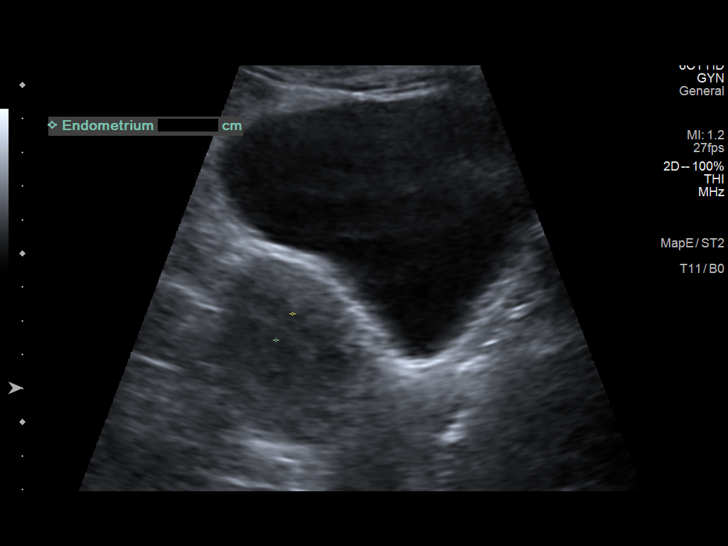
[im 21/81]
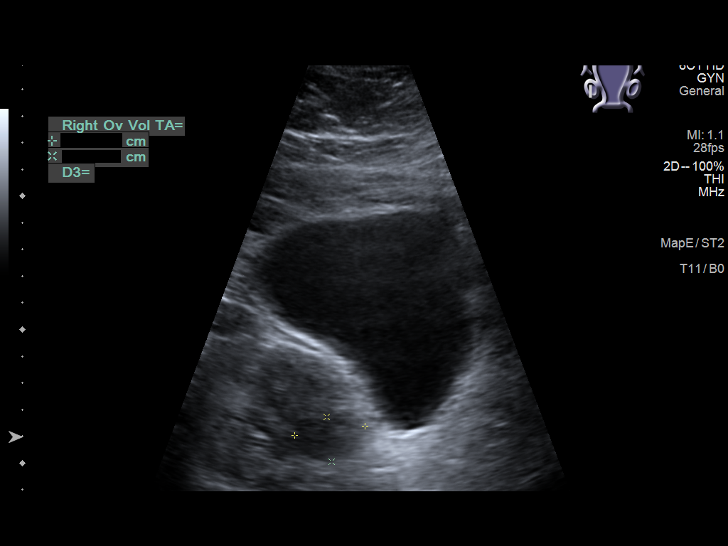
[im 27/81]
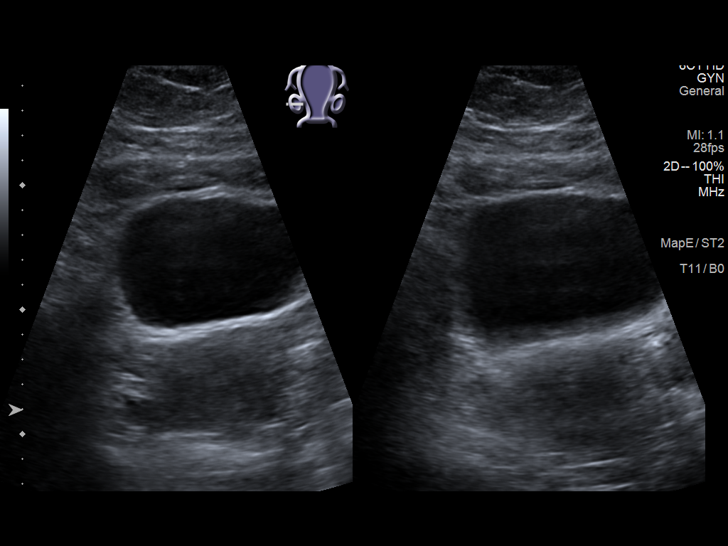
[im 31/81]
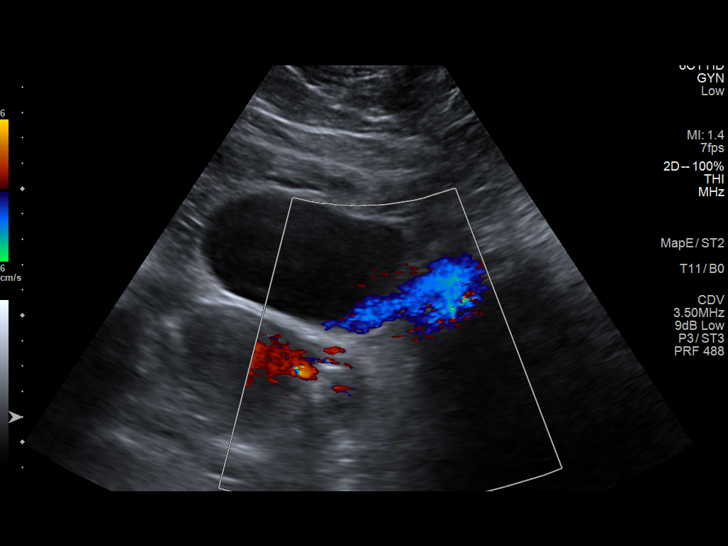
[im 37/81]
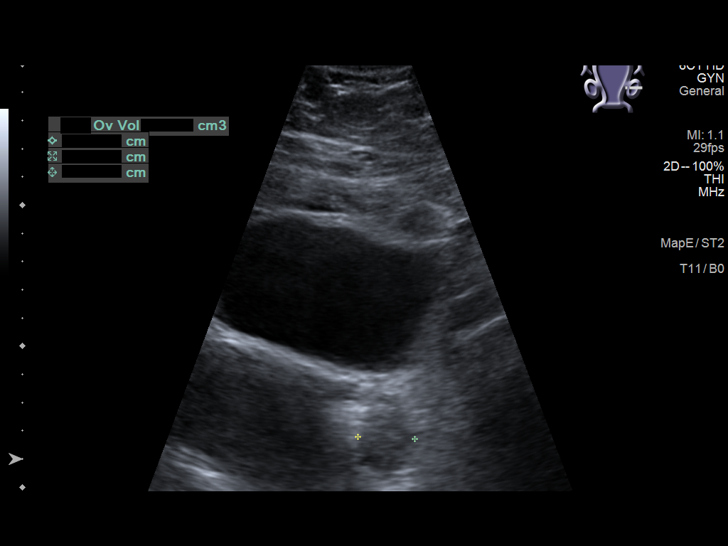
[im 44/81]
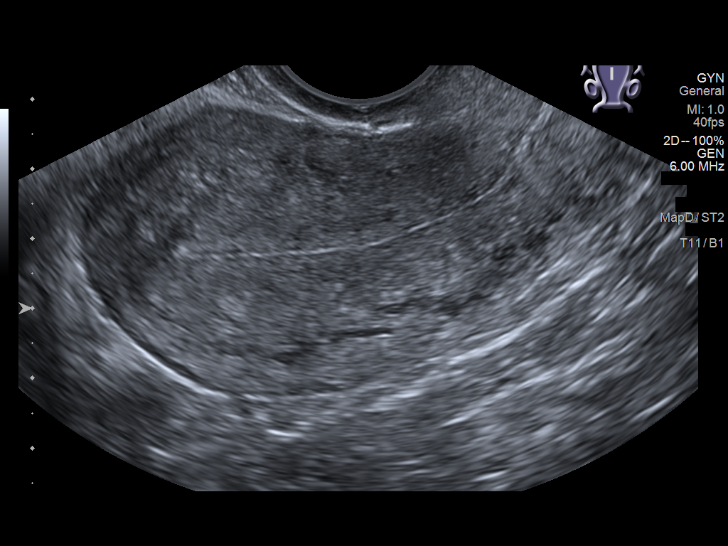
[im 51/81]
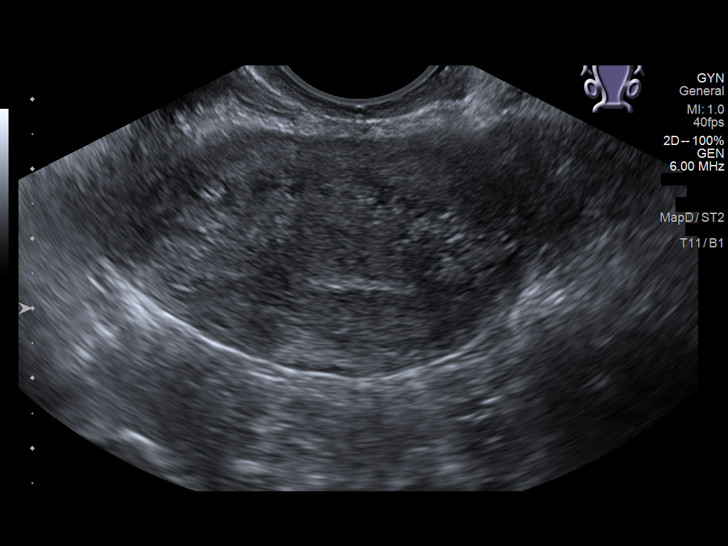
[im 54/81]
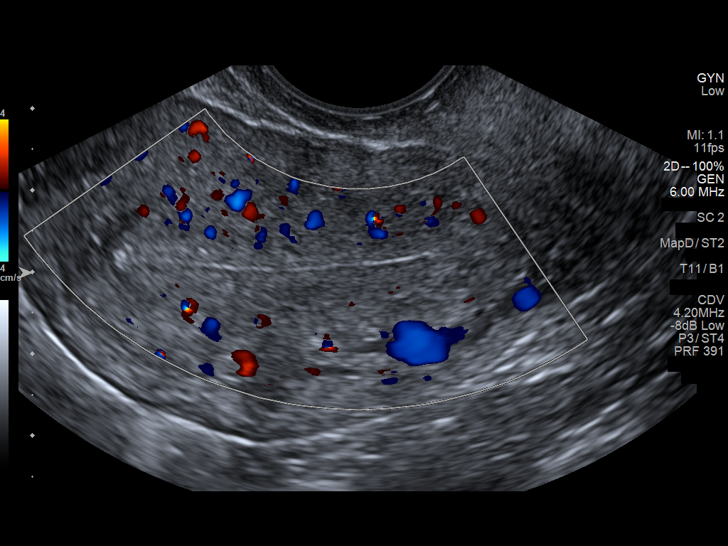
[im 61/81]
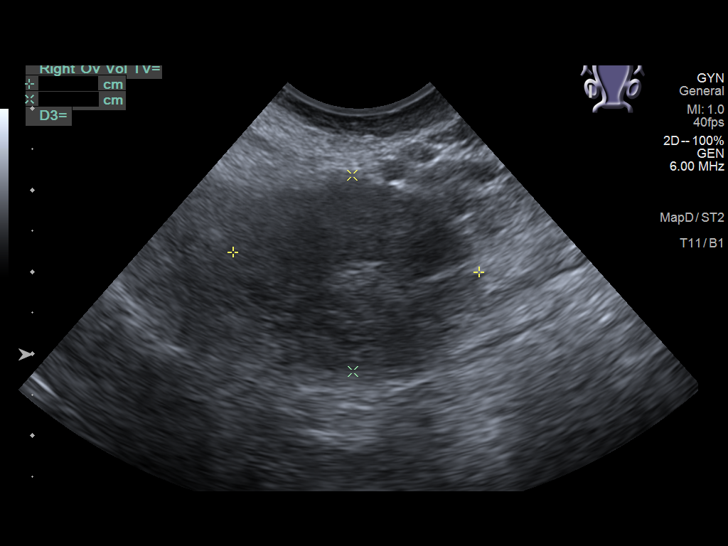
[im 67/81]
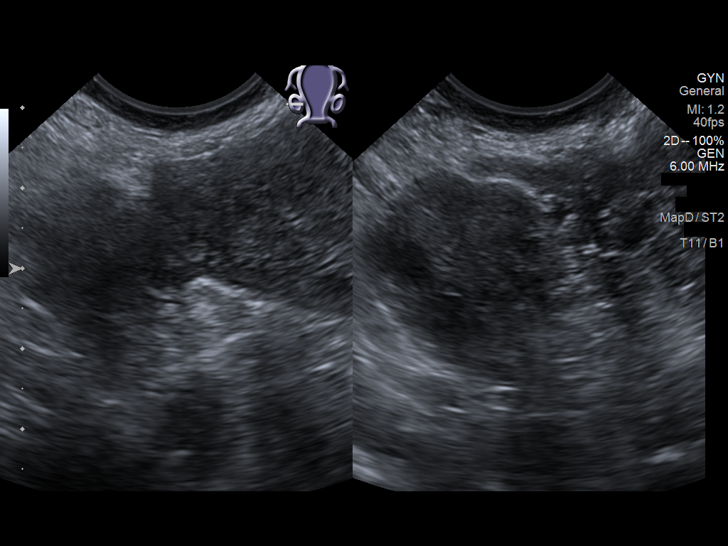
[im 74/81]
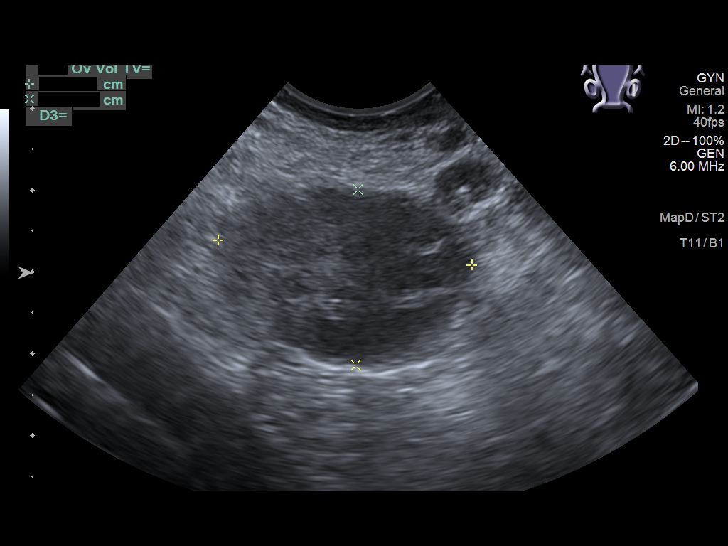
[im 81/81]
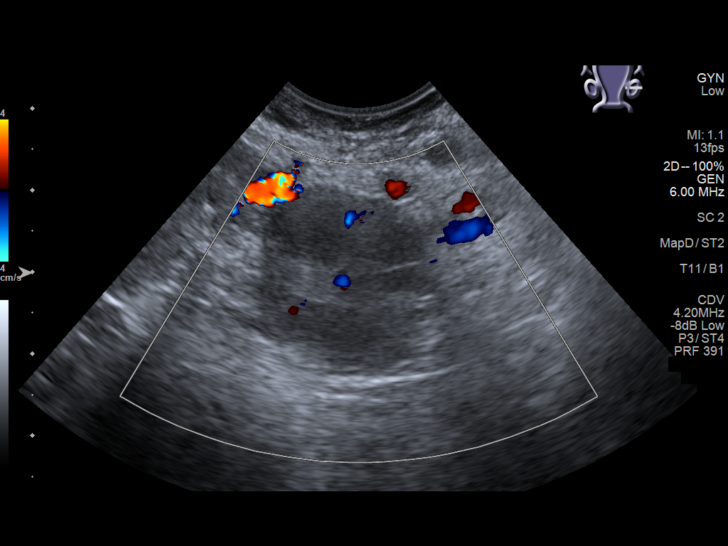

[14 of 25 positions shown; findings below may reference images not displayed]

FINDINGS: Uterus

Measurements: 8.5 x 4.4 x 5.7 cm = volume: 111 mL. No fibroids or
other mass visualized.

Endometrium

Thickness: 5 mm.  No focal abnormality visualized.

Right ovary

Measurements: 3.0 x 2.4 x 2.4 cm = volume: 9.0 mL. Normal
appearance. 1.0 cm dominant follicle is seen.

Left ovary

Measurements: 3.1 x 2.1 x 2.2 cm = volume: 7.7 mL. Interval
resolution of previously seen complex left ovarian lesion. Normal
appearance.

Other findings

No abnormal free fluid.
IMPRESSION: Interval resolution of previously seen complex left ovarian cyst. No
significant sonographic abnormality of the uterus or ovaries.
